# Patient Record
Sex: Male | Born: 1960 | Race: White | Hispanic: No | Marital: Married | Smoking: Current every day smoker
Health system: Southern US, Community
[De-identification: ages and names within clinical notes are randomized; demographics above are authoritative.]

## PROBLEM LIST (undated history)

## (undated) DIAGNOSIS — E119 Type 2 diabetes mellitus without complications: Secondary | ICD-10-CM

## (undated) HISTORY — PX: ROTATOR CUFF REPAIR: SHX139

## (undated) HISTORY — PX: TONSILLECTOMY: SUR1361

## (undated) HISTORY — PX: ABCESS DRAINAGE: SHX399

## (undated) HISTORY — PX: CHOLECYSTECTOMY: SHX55

## (undated) HISTORY — DX: Type 2 diabetes mellitus without complications: E11.9

---

## 2015-10-19 ENCOUNTER — Encounter: Payer: Self-pay | Admitting: Osteopathic Medicine

## 2015-10-19 ENCOUNTER — Ambulatory Visit (INDEPENDENT_AMBULATORY_CARE_PROVIDER_SITE_OTHER): Payer: Managed Care, Other (non HMO) | Admitting: Osteopathic Medicine

## 2015-10-19 VITALS — BP 148/79 | HR 84 | Wt 226.0 lb

## 2015-10-19 DIAGNOSIS — M9901 Segmental and somatic dysfunction of cervical region: Secondary | ICD-10-CM | POA: Diagnosis not present

## 2015-10-19 DIAGNOSIS — E119 Type 2 diabetes mellitus without complications: Secondary | ICD-10-CM

## 2015-10-19 DIAGNOSIS — M6530 Trigger finger, unspecified finger: Secondary | ICD-10-CM | POA: Diagnosis not present

## 2015-10-19 HISTORY — DX: Type 2 diabetes mellitus without complications: E11.9

## 2015-10-19 MED ORDER — IBUPROFEN 800 MG PO TABS
800.0000 mg | ORAL_TABLET | Freq: Three times a day (TID) | ORAL | Status: DC | PRN
Start: 2015-10-19 — End: 2017-02-20

## 2015-10-19 NOTE — Progress Notes (Signed)
HPI: Jeffrey Nichols is a 54 y.o. male who [facility] [place]  [place] for chief complaint of:  Chief Complaint  Patient presents with  . Hand Pain    left third finger and right thumb    . Location: R thumb and L 3rd finger . Quality: locking,  painful  . Duration: 1 month . Timing: painful with locking, otherwise dull pain  . Context:  no injury to her hands  . Modifying factors: no over-the-counter medications tried  . Assoc signs/symptoms: No numbness or tingling  Patient also complains of some chronic upper back and lower neck pain, chronic, occasional right shoulder pain, he has had surgery done on this shoulder in the past for rotator cuff.  Diabetes: Patient looking to transfer care to PCP who is closer to home, diabetes well-controlled, working with diabetic counselor through his employer, reports last A1c a few weeks ago was 7, no records available  Past medical, social and family history reviewed: Past Medical History  Diagnosis Date  . Diabetes mellitus [facility](HCC)    Past Surgical History  Procedure Laterality Date  . Rotator cuff repair Right   . Abcess drainage      Nipple   Social History  Substance Use Topics  . Smoking status: Current Every Day Smoker -- 1.00 packs/day for 30 years    Types: Cigarettes  . Smokeless tobacco: Not on file  . Alcohol Use: No   Family History  Problem Relation Age of Onset  . Bladder Cancer Father   . Skin cancer Father   . Diabetes Mother   . Diabetes Father     Current Outpatient Prescriptions  Medication Sig Dispense Refill  . enalapril (VASOTEC) 20 MG tablet     . glipiZIDE (GLUCOTROL) 5 MG tablet TAKE ONE TABLET BY MOUTH TWICE DAILY BEFORE MEAL(S)    . metFORMIN (GLUCOPHAGE) 1000 MG tablet TAKE ONE TABLET BY MOUTH TWICE DAILY WITH MEALS     No current facility-administered medications for this visit.   No Known Allergies    Review of Systems: CONSTITUTIONAL:  No  fever, no chills,  No  unintentional weight changes HEAD/EYES/EARS/NOSE/THROAT: No headache, no vision change, no hearing change, No  sore throat CARDIAC: No chest pain, no pressure/palpitations, no orthopnea RESPIRATORY: No  cough, No  shortness of breath/wheeze GASTROINTESTINAL: No nausea, no vomiting, no abdominal pain, no blood in stool, no diarrhea, no constipation MUSCULOSKELETAL: Yes  myalgia/arthralgia GENITOURINARY: No incontinence, No abnormal genital bleeding/discharge SKIN: No rash/wounds/concerning lesions HEM/ONC: No easy bruising/bleeding, no abnormal lymph node ENDOCRINE: No polyuria/polydipsia/polyphagia, no heat/cold intolerance  NEUROLOGIC: No weakness, no dizziness, no slurred speech PSYCHIATRIC: No concerns with depression, no concerns with anxiety, no sleep problems    Exam:  BP 148/79 mmHg  Pulse 84  Wt 226 lb (102.513 kg)  SpO2 100% Constitutional: VSS, see above. General Appearance: alert, well-developed, well-nourished, NAD Eyes: Normal lids and conjunctive, non-icteric sclera, Respiratory: Normal respiratory effort. no wheeze, no rhonchi, no rales Cardiovascular: S1/S2 normal, no murmur, no rub/gallop auscultated. RRR.  Musculoskeletal: Gait normal. No clubbing/cyanosis of digits.  positive locking on flexion and extension of left middle finger and right thumb consistent with trigger finger. Positive paraspinal tenderness right trapezius area, no mass, normal range of motion in cervical spine, Spurling's negative on right  Neurological: No cranial nerve deficit on limited exam. Motor and sensation intact and symmetric Psychiatric: Normal judgment/insight. Normal mood and affect. Oriented x3.    No results found for this  or any previous visit (from the past 72 hour(s)).    ASSESSMENT/PLAN: patient seen with the help of Dr. Clementeen Graham, patient declines injection for trigger finger at this time but advised that this is an option for him if pain continues, in the meantime  anti-inflammatory medication and splinting techniques were advised to the patient, he is amenable to trying this conservative therapy. Advised patient to return to clinic for OMT treatments of neck and thoracic spine. Advised return to clinic in 1-2 months for repeat blood work and annual physical exam  Trigger finger, left - Plan: ibuprofen (ADVIL,MOTRIN) 800 MG tablet  Trigger finger, right - Plan: ibuprofen (ADVIL,MOTRIN) 800 MG tablet  Somatic dysfunction of spine, cervical     No Follow-up on file.

## 2015-10-19 NOTE — Patient Instructions (Signed)
Trigger Finger °Trigger finger (digital tendinitis and stenosing tenosynovitis) is a common disorder that causes an often painful catching of the fingers or thumb. It occurs as a clicking, snapping, or locking of a finger in the palm of the hand. This is caused by a problem with the tendons that flex or bend the fingers sliding smoothly through their sheaths. The condition may occur in any finger or a couple fingers at the same time.  °The finger may lock with the finger curled or suddenly straighten out with a snap. This is more common in patients with rheumatoid arthritis and diabetes. Left untreated, the condition may get worse to the point where the finger becomes locked in flexion, like making a fist, or less commonly locked with the finger straightened out. °CAUSES  °· Inflammation and scarring that lead to swelling around the tendon sheath. °· Repeated or forceful movements. °· Rheumatoid arthritis, an autoimmune disease that affects joints. °· Gout. °· Diabetes mellitus. °SIGNS AND SYMPTOMS °· Soreness and swelling of your finger. °· A painful clicking or snapping as you bend and straighten your finger. °DIAGNOSIS  °Your health care provider will do a physical exam of your finger to diagnose trigger finger. °TREATMENT  °· Splinting for 6-8 weeks may be helpful. °· Nonsteroidal anti-inflammatory medicines (NSAIDs) can help to relieve the pain and inflammation. °· Cortisone injections, along with splinting, may speed up recovery. Several injections may be required. Cortisone may give relief after one injection. °· Surgery is another treatment that may be used if conservative treatments do not work. Surgery can be minor, without incisions (a cut does not have to be made), and can be done with a needle through the skin. °· Other surgical choices involve an open procedure in which the surgeon opens the hand through a small incision and cuts the pulley so the tendon can again slide smoothly. Your hand will still  work fine. °HOME CARE INSTRUCTIONS °· Apply ice to the injured area, twice per day: °¨ Put ice in a plastic bag. °¨ Place a towel between your skin and the bag. °¨ Leave the ice on for 20 minutes, 3-4 times a day. °· Rest your hand often. °MAKE SURE YOU:  °· Understand these instructions. °· Will watch your condition. °· Will get help right away if you are not doing well or get worse. °  °This information is not intended to replace advice given to you by your health care provider. Make sure you discuss any questions you have with your health care provider. °  °Document Released: 09/01/2004 Document Revised: 07/15/2013 Document Reviewed: 04/14/2013 °Elsevier Interactive Patient Education ©2016 [employer] ° °

## 2016-01-09 ENCOUNTER — Encounter: Payer: Self-pay | Admitting: Osteopathic Medicine

## 2016-01-09 ENCOUNTER — Ambulatory Visit (INDEPENDENT_AMBULATORY_CARE_PROVIDER_SITE_OTHER): Payer: Managed Care, Other (non HMO) | Admitting: Osteopathic Medicine

## 2016-01-09 VITALS — BP 133/54 | HR 72 | Ht 70.0 in | Wt 228.0 lb

## 2016-01-09 DIAGNOSIS — Z1159 Encounter for screening for other viral diseases: Secondary | ICD-10-CM

## 2016-01-09 DIAGNOSIS — M1990 Unspecified osteoarthritis, unspecified site: Secondary | ICD-10-CM

## 2016-01-09 DIAGNOSIS — M899 Disorder of bone, unspecified: Secondary | ICD-10-CM

## 2016-01-09 DIAGNOSIS — E119 Type 2 diabetes mellitus without complications: Secondary | ICD-10-CM

## 2016-01-09 DIAGNOSIS — M259 Joint disorder, unspecified: Secondary | ICD-10-CM

## 2016-01-09 DIAGNOSIS — I10 Essential (primary) hypertension: Secondary | ICD-10-CM | POA: Diagnosis not present

## 2016-01-09 LAB — POCT GLYCOSYLATED HEMOGLOBIN (HGB A1C): Hemoglobin A1C: 7.3

## 2016-01-09 MED ORDER — GLIPIZIDE 5 MG PO TABS: 5.0000 mg | ORAL_TABLET | Freq: Two times a day (BID) | ORAL | Status: DC

## 2016-01-09 MED ORDER — METFORMIN HCL 1000 MG PO TABS: 1000.0000 mg | ORAL_TABLET | Freq: Two times a day (BID) | ORAL | Status: DC

## 2016-01-09 MED ORDER — ENALAPRIL MALEATE 20 MG PO TABS: 40.0000 mg | ORAL_TABLET | Freq: Every day | ORAL | Status: DC

## 2016-01-09 NOTE — Patient Instructions (Addendum)
DIABETES -   A1C is 7.3. Work on diet and exercise to improve sugar control. We will follow up in 3 months. If A1C is better, great! If no better or if worse, we can talk about possible medication adjustments.   You are due for diabetic eye exam. Please let us know if you'd like a referral. When you get this exam done, please ask your eye doctor to forward a copy of your results to our office.    OTHER PREVENTIVE CARE -   Please sign a records release so we can review your previous records, particularly your immunization history.  The best thing you can do to prevent heart attack, stroke and lung problems is QUIT SMOKING! If you'd like help with this, please schedule a visit to discuss possible medication options.    TRIGGER FINGER -   If you'd like to follow up with Dr. Denyse Amass for an injection, please call to schedule an appointment. If you'd like referral to an orthopedic surgeon, please let us know and we will place a referral.      THANKS FOR COMING IN TODAY! PLEASE LET us KNOW IF THERE IS ANYTHING WE CAN DO FOR YOU! -DR. A.

## 2016-01-09 NOTE — Progress Notes (Signed)
HPI: Jeffrey Nichols is a 55 y.o. male who presents to [place] today for chief complaint of:  Chief Complaint  Patient presents with  . Annual Exam      Preventive care reviewed as below  DIABETES SCREENING/PREVENTIVE CARE: A1C past 3-6 mos: Yes  controlled? 7.3 BP goal <140/90: Yes  LDL goal <70: await labs Eye exam annually: No , importance discussed with patient.  Foot exam: Yes  Microalbuminuria:No need Metformin: Yes  ACE/ARB: Yes  Antiplatelet if ASCVD Risk >10%: await lipids to calculate risk Statin: No     Past medical, social and family history reviewed: Past Medical History  Diagnosis Date  . Diabetes mellitus ([place])   . Diabetes type 2, controlled ([place]) 10/19/2015   Past Surgical History  Procedure Laterality Date  . Rotator cuff repair Right   . Abcess drainage      Nipple   Social History  Substance Use Topics  . Smoking status: Current Every Day Smoker -- 1.00 packs/day for 30 years    Types: Cigarettes  . Smokeless tobacco: Not on file  . Alcohol Use: No   Family History  Problem Relation Age of Onset  . Bladder Cancer Father   . Skin cancer Father   . Diabetes Mother   . Diabetes Father     Current Outpatient Prescriptions  Medication Sig Dispense Refill  . enalapril (VASOTEC) 20 MG tablet     . glipiZIDE (GLUCOTROL) 5 MG tablet TAKE ONE TABLET BY MOUTH TWICE DAILY BEFORE MEAL(S)    . ibuprofen (ADVIL,MOTRIN) 800 MG tablet Take 1 tablet (800 mg total) by mouth every 8 (eight) hours as needed. 30 tablet 1  . metFORMIN (GLUCOPHAGE) 1000 MG tablet TAKE ONE TABLET BY MOUTH TWICE DAILY WITH MEALS     No current facility-administered medications for this visit.   No Known Allergies    Review of Systems: CONSTITUTIONAL:  No  fever, no chills, No  unintentional weight changes HEAD/EYES/EARS/NOSE/THROAT: No  headache, no vision change, no hearing change, No  sore throat, No  sinus pressure CARDIAC: No  chest  pain, No  pressure, No palpitations, No  orthopnea RESPIRATORY: No  cough, No  shortness of breath/wheeze GASTROINTESTINAL: No  nausea, No  vomiting, No  abdominal pain, No  blood in stool, No  diarrhea, No  constipation  MUSCULOSKELETAL: No  myalgia/arthralgia GENITOURINARY: No  incontinence, No  abnormal genital bleeding/discharge SKIN: No  rash/wounds/concerning lesions HEM/ONC: No  easy bruising/bleeding, No  abnormal lymph node ENDOCRINE: No polyuria/polydipsia/polyphagia, No  heat/cold intolerance  NEUROLOGIC: No  weakness, No  dizziness, No  slurred speech PSYCHIATRIC: No  concerns with depression, No  concerns with anxiety, No sleep problems  Exam:  BP 133/54 mmHg  Pulse 72  Ht _0  (1.778 m)  Wt 228 lb (103.42 kg)  BMI 32.71 kg/m2 Constitutional: VS see above. General Appearance: alert, well-developed, well-nourished, NAD Eyes: Normal lids and conjunctive, non-icteric sclera,  Ears, Nose, Mouth, Throat: MMM, Normal external inspection ears/nares/mouth/lips/gums,  Respiratory: Normal respiratory effort. (+) diffuse wheeze, no rhonchi, no rales Cardiovascular: S1/S2 normal, no murmur, no rub/gallop auscultated. RRR. No lower extremity edema. Gastrointestinal: Nontender, no masses. No hepatomegaly, no splenomegaly. No hernia appreciated. Bowel sounds normal. Rectal exam deferred.  Musculoskeletal: Gait normal. No clubbing/cyanosis of digits.  Neurological: No cranial nerve deficit on limited exam. Motor and sensation intact and symmetric Skin: warm, dry, intact. No rash/ulcer. No concerning nevi or subq nodules on limited exam.   Psychiatric:  Normal judgment/insight. Normal mood and affect. Oriented x3.  Diabetic Foot Exam - Simple   Simple Foot Form  Diabetic Foot exam was performed with the following findings:  Yes 01/09/2016  3:50 PM  Visual Inspection  No deformities, no ulcerations, no other skin breakdown bilaterally:  Yes  Sensation Testing  Intact to touch and  monofilament testing bilaterally:  Yes  Pulse Check  Posterior Tibialis and Dorsalis pulse intact bilaterally:  Yes  Comments        Results for orders placed or performed in visit on 01/09/16 (from the past 72 hour(s))  POCT HgB A1C     Status: None   Collection Time: 01/09/16  3:36 PM  Result Value Ref Range   Hemoglobin A1C 7.3     MALE PREVENTIVE CARE  ANNUAL SCREENING/COUNSELING Tobacco - Current  Smoked 1 packs per day for 30 years  - interested in quitting and made small attempts  Alcohol - social drinker Diet/Exercise - HEALTHY HABITS DISCUSSED TO DECREASE CV RISK -  Sexual Health - Yes with male. STI - The patient denies history of sexually transmitted disease. INTERESTED IN STI TESTING - no Depression - PQH2 Negative Domestic violence concerns - no HTN SCREENING - SEE VITALS Vaccination status - SEE BELOW  INFECTIOUS DISEASE SCREENING HIV - all adults 15-65 - does not need GC/CT - sexually active - does not need HepC - born 84-1964 - does not need, previously negative TB - if risk/required by employer - does not need  DISEASE SCREENING Lipid - (Low risk screen M35; High risk screen M25 if HTN, Tob, FH CHD M<55/F<65) - needs DM2 (45+ or Risk = FH 1st deg DM, Hx GDM, overweight/sedentary, high-risk ethnicity, HTN) - needs Osteoporosis - age 84+ or one sooner if risk - does not need AAA - once age 46-75 if smoker - does not need  CANCER SCREENING Lung - annual low dose CT Chest age 79-85 w/ 30+ PY, current/quit past 15 years - CT - does not need Colon - age 82+ or 54 years of age prior to [place] Dx - GI REFERRAL - needs - will get Cologuard  Prostate - (shared decision making age 45 in average-risk, age 60 to 14 high risk black men, men with a family history of prostate cancer younger than age 83, BRCA1 or BRCA2) - does not need   ADULT VACCINATION Influenza - annual - was offered and declined by the patient Td booster every 10 years - already has HPV - age  <62yo - was not indicated Zoster - age 40+ - was offered and declined by the patient Pneumonia - age 53+ sooner if risk (DM, smoker, other) - already has - need recods  OTHER Fall - exercise and Vit D age 35+ - does not need Consider ASA - age 72-59 - AWAIT LABS    ASSESSMENT/PLAN: See patient instructions.  Need to check on status of pneumonia and tetanus vaccines - await records. Declines flu and zoster vaccines. Cologuard. Needs eye exam. Labs as below.   Type 2 diabetes mellitus without complication, unspecified long term insulin use status ([place]) - Plan: POCT HgB A1C, glipiZIDE (GLUCOTROL) 5 MG tablet, metFORMIN (GLUCOPHAGE) 1000 MG tablet, CBC with Differential/Platelet, COMPLETE METABOLIC PANEL WITH GFR, Lipid panel  Essential hypertension - Plan: enalapril (VASOTEC) 20 MG tablet, TSH  Arthritis - Plan: VITAMIN D 25 Hydroxy (Vit-D Deficiency, Fractures)  Disease of bone and joint - Plan: VITAMIN D 25 Hydroxy (Vit-D Deficiency, Fractures)  Need for hepatitis  C screening test - Plan: Hepatitis C antibody   Return in about 3 months (around 04/07/2016), or sooner if needed, for DIABETES - RECHECK A1C.

## 2016-03-01 LAB — CBC WITH DIFFERENTIAL/PLATELET
BASOS PCT: 1 %
Basophils Absolute: 38 cells/uL (ref 0–200)
Eosinophils Absolute: 114 cells/uL (ref 15–500)
Eosinophils Relative: 3 %
HEMATOCRIT: 44.4 % (ref 38.5–50.0)
Hemoglobin: 15 g/dL (ref 13.2–17.1)
LYMPHS PCT: 32 %
Lymphs Abs: 1216 cells/uL (ref 850–3900)
MCH: 30.1 pg (ref 27.0–33.0)
MCHC: 33.8 g/dL (ref 32.0–36.0)
MCV: 89.2 fL (ref 80.0–100.0)
MONO ABS: 342 {cells}/uL (ref 200–950)
MONOS PCT: 9 %
MPV: 10.7 fL (ref 7.5–12.5)
Neutro Abs: 2090 cells/uL (ref 1500–7800)
Neutrophils Relative %: 55 %
PLATELETS: 110 10*3/uL — AB (ref 140–400)
RBC: 4.98 MIL/uL (ref 4.20–5.80)
RDW: 14.2 % (ref 11.0–15.0)
WBC: 3.8 10*3/uL (ref 3.8–10.8)

## 2016-03-02 LAB — COMPLETE METABOLIC PANEL WITH GFR
ALT: 37 U/L (ref 9–46)
AST: 31 U/L (ref 10–35)
Albumin: 3.9 g/dL (ref 3.6–5.1)
Alkaline Phosphatase: 70 U/L (ref 40–115)
BILIRUBIN TOTAL: 0.5 mg/dL (ref 0.2–1.2)
BUN: 10 mg/dL (ref 7–25)
CO2: 19 mmol/L — ABNORMAL LOW (ref 20–31)
CREATININE: 0.64 mg/dL — AB (ref 0.70–1.33)
Calcium: 8.8 mg/dL (ref 8.6–10.3)
Chloride: 108 mmol/L (ref 98–110)
GFR, Est African American: >89 mL/min (ref >=60)
GFR, Est Non African American: >89 mL/min (ref >=60)
Glucose, Bld: 106 mg/dL — ABNORMAL HIGH (ref 65–99)
Potassium: 4.1 mmol/L (ref 3.5–5.3)
Sodium: 139 mmol/L (ref 135–146)
Total Protein: 7 g/dL (ref 6.1–8.1)

## 2016-03-02 LAB — HEPATITIS C ANTIBODY: HCV Ab: NEGATIVE

## 2016-03-02 LAB — LIPID PANEL
CHOL/HDL RATIO: 3.9 ratio (ref <=5.0)
Cholesterol: 122 mg/dL — ABNORMAL LOW (ref 125–200)
HDL: 31 mg/dL — AB (ref >=40)
LDL CALC: 72 mg/dL (ref <130)
Triglycerides: 93 mg/dL (ref <150)
VLDL: 19 mg/dL (ref <30)

## 2016-03-02 LAB — VITAMIN D 25 HYDROXY (VIT D DEFICIENCY, FRACTURES): VIT D 25 HYDROXY: 43 ng/mL (ref 30–100)

## 2016-03-02 LAB — TSH: TSH: 2.44 m[IU]/L (ref 0.40–4.50)

## 2016-03-06 ENCOUNTER — Encounter: Payer: Self-pay | Admitting: Osteopathic Medicine

## 2016-03-06 ENCOUNTER — Ambulatory Visit (INDEPENDENT_AMBULATORY_CARE_PROVIDER_SITE_OTHER): Payer: Managed Care, Other (non HMO) | Admitting: Osteopathic Medicine

## 2016-03-06 VITALS — BP 151/88 | HR 85 | Wt 229.0 lb

## 2016-03-06 DIAGNOSIS — L989 Disorder of the skin and subcutaneous tissue, unspecified: Secondary | ICD-10-CM | POA: Diagnosis not present

## 2016-03-06 DIAGNOSIS — R6889 Other general symptoms and signs: Secondary | ICD-10-CM

## 2016-03-06 DIAGNOSIS — D696 Thrombocytopenia, unspecified: Secondary | ICD-10-CM | POA: Diagnosis not present

## 2016-03-06 DIAGNOSIS — E119 Type 2 diabetes mellitus without complications: Secondary | ICD-10-CM

## 2016-03-06 NOTE — Progress Notes (Signed)
HPI: Jeffrey Nichols is a 55 y.o. male who presents to [facility] today for chief complaint of:  Chief Complaint  Patient presents with  . check lesions    SKIN . Location: scalp and privates . Quality: bothersome lesions, itchy, also wants to make sure not cancer  . Duration: reports groin lesions have been present several years, not sure about ones on head, no significant recent changes to these . Assoc signs/symptoms: no ulceration of skin, no significant pain  TESTES He's also worried about testicular abnormality on the L - has had w/u of this before and was no concern but now becoming more painful over time. No abnormal penile pain or discharge.   LAB RESULTS Pt would like to go over labs - cholesterol, diabetes, platelets   Past medical, social and family history reviewed: Past Medical History  Diagnosis Date  . Diabetes mellitus ([facility])   . Diabetes type 2, controlled ([facility]) 10/19/2015   Past Surgical History  Procedure Laterality Date  . Rotator cuff repair Right   . Abcess drainage      Nipple   Social History  Substance Use Topics  . Smoking status: Current Every Day Smoker -- 1.00 packs/day for 30 years    Types: Cigarettes  . Smokeless tobacco: Not on file  . Alcohol Use: No   Family History  Problem Relation Age of Onset  . Bladder Cancer Father   . Skin cancer Father   . Diabetes Mother   . Diabetes Father     Current Outpatient Prescriptions  Medication Sig Dispense Refill  . enalapril (VASOTEC) 20 MG tablet Take 2 tablets (40 mg total) by mouth daily. 180 tablet 2  . glipiZIDE (GLUCOTROL) 5 MG tablet Take 1 tablet (5 mg total) by mouth 2 (two) times daily before a meal. 180 tablet 2  . ibuprofen (ADVIL,MOTRIN) 800 MG tablet Take 1 tablet (800 mg total) by mouth every 8 (eight) hours as needed. 30 tablet 1  . metFORMIN (GLUCOPHAGE) 1000 MG tablet Take 1 tablet (1,000 mg total) by mouth 2 (two) times daily with a meal. 180  tablet 2  . Misc Natural Products (OSTEO BI-FLEX ADV TRIPLE ST) TABS Take by mouth.     No current facility-administered medications for this visit.   No Known Allergies     Review of Systems: CONSTITUTIONAL:  No  fever, no chills, No  unintentional weight changes HEAD/EYES/EARS/NOSE/THROAT: No  headache, no vision change CARDIAC: No  chest pain, No  pressure, No palpitations,  SKIN: (+) rash/wounds/concerning lesions HEM/ONC: No  easy bruising/bleeding, No  abnormal lymph node PSYCHIATRIC: No  concerns with depression, (+) concerns with anxiety about lab results and skin lesions, No sleep problems  Exam:  BP 151/88 mmHg  Pulse 85  Wt 229 lb (103.874 kg) Constitutional: VS see above. General Appearance: alert, well-developed, well-nourished, NAD Eyes: Normal lids and conjunctive, non-icteric sclera,  Respiratory: Normal respiratory effort. no wheeze, no rhonchi, no rales Cardiovascular: S1/S2 normal, no murmur, no rub/gallop auscultated. RRR.  Neurological: No cranial nerve deficit on limited exam. Motor and sensation intact and symmetric Skin: warm, dry, intact. Skin of scalp: crown of head (+) hyperpigmented lesions with irregular borders, no erythema, mild discoloration but concerning for non-benign lesion. 2 other lesions c/w actinic keratosis on top head and possible seborrheic keratosis above L ear. Rough stuck-on appearing hyperpigmented lesions one on penis, one on mons, on eon scrotum - c/w possible hyperpigmented seborrheic keratoses.  Psychiatric: Normal judgment/insight.  Normal mood and affect. Oriented x3.  Genitalia: see above re: skin. L testicle ?nodule vs stricture around testis, no varicosity, nontender. R testicle palpated normal.      ASSESSMENT/PLAN: Concern for possible malignancy on scalp - urgent referral to derm to consider biopsy as I think wide excision may be needed and I am reluctant to do this procedure in my office on the scalp - seek second opinion on  this as well as confirmation of diagnosis for other benign lesions and pt would like to discuss removal of the lesions on genitals. Will also schedule US of testicles, very low suspicion for torsion, though ER/RTC precautions reviewed, no records available from previous w/u. Labs reviewed in detail with the patient, discussed cardiac risk factors at length and importance of quitting smoking.   Skin lesion of scalp - Plan: Ambulatory referral to Dermatology  Skin lesion - Plan: Ambulatory referral to Dermatology  Abnormal testicular exam  Type 2 diabetes mellitus without complication, unspecified long term insulin use status (HCC)  Thrombocytopenia (HCC)     Return as directed for DM2 monitoring.

## 2016-03-07 DIAGNOSIS — E119 Type 2 diabetes mellitus without complications: Secondary | ICD-10-CM | POA: Insufficient documentation

## 2016-03-07 DIAGNOSIS — D696 Thrombocytopenia, unspecified: Secondary | ICD-10-CM | POA: Insufficient documentation

## 2016-03-07 DIAGNOSIS — R6889 Other general symptoms and signs: Secondary | ICD-10-CM | POA: Insufficient documentation

## 2016-03-07 DIAGNOSIS — L989 Disorder of the skin and subcutaneous tissue, unspecified: Secondary | ICD-10-CM | POA: Insufficient documentation

## 2016-05-23 ENCOUNTER — Ambulatory Visit: Payer: Managed Care, Other (non HMO) | Admitting: Osteopathic Medicine

## 2016-06-07 ENCOUNTER — Telehealth: Payer: Self-pay

## 2016-06-07 NOTE — Telephone Encounter (Signed)
COLOGUARD WAS SUSPENDED FOR INACTIVITY ON 02/13/201. IT WAS EXCEEDED  DAYS WITHOUT ACTIVITY. PATIENT HAS BEEN INFORMED. Byrne Capek,CMA

## 2016-06-18 ENCOUNTER — Ambulatory Visit (INDEPENDENT_AMBULATORY_CARE_PROVIDER_SITE_OTHER): Payer: Managed Care, Other (non HMO) | Admitting: Osteopathic Medicine

## 2016-06-18 ENCOUNTER — Encounter: Payer: Self-pay | Admitting: Osteopathic Medicine

## 2016-06-18 VITALS — BP 129/81 | HR 76 | Ht 70.0 in | Wt 225.0 lb

## 2016-06-18 DIAGNOSIS — E119 Type 2 diabetes mellitus without complications: Secondary | ICD-10-CM

## 2016-06-18 DIAGNOSIS — J0110 Acute frontal sinusitis, unspecified: Secondary | ICD-10-CM | POA: Diagnosis not present

## 2016-06-18 LAB — POCT GLYCOSYLATED HEMOGLOBIN (HGB A1C): HEMOGLOBIN A1C: 6.6

## 2016-06-18 MED ORDER — AMOXICILLIN-POT CLAVULANATE 875-125 MG PO TABS: 1.0000 | ORAL_TABLET | Freq: Two times a day (BID) | ORAL | 0 refills | Status: DC

## 2016-06-18 NOTE — Progress Notes (Signed)
HPI: Jeffrey Nichols is a 55 y.o. Not Hispanic or Latino male  who presents to [facility] [place] today, 06/18/16,  for chief complaint of:  Chief Complaint  Patient presents with  . Follow-up    A1C    DM2 - A1c  improvement on metformin and glipizide.   HEADACHE - going on for about a week or so, occasional dizziness. Thinks it may have something to do with sinus, recently had cold/sinus congestion symptoms, seems of localized in forehead area. No fever/chills. No loss of consciousness, no falls. No vision changes or aura symptoms. Actively smoking. No other medications tried for pain    Past medical, surgical, social and family history reviewed: Past Medical History:  Diagnosis Date  . Diabetes mellitus (HCC)   . Diabetes type 2, controlled (HCC) 10/19/2015   Past Surgical History:  Procedure Laterality Date  . ABCESS DRAINAGE     Nipple  . ROTATOR CUFF REPAIR Right    Social History  Substance Use Topics  . Smoking status: Current Every Day Smoker    Packs/day: 1.00    Years: 30.00    Types: Cigarettes  . Smokeless tobacco: Not on file  . Alcohol use No   Family History  Problem Relation Age of Onset  . Bladder Cancer Father   . Skin cancer Father   . Diabetes Mother   . Diabetes Father      Current medication list and allergy/intolerance information reviewed:   Current Outpatient Prescriptions  Medication Sig Dispense Refill  . enalapril (VASOTEC) 20 MG tablet Take 2 tablets (40 mg total) by mouth daily. 180 tablet 2  . glipiZIDE (GLUCOTROL) 5 MG tablet Take 1 tablet (5 mg total) by mouth 2 (two) times daily before a meal. 180 tablet 2  . ibuprofen (ADVIL,MOTRIN) 800 MG tablet Take 1 tablet (800 mg total) by mouth every 8 (eight) hours as needed. 30 tablet 1  . metFORMIN (GLUCOPHAGE) 1000 MG tablet Take 1 tablet (1,000 mg total) by mouth 2 (two) times daily with a meal. 180 tablet 2  . Misc Natural Products (OSTEO BI-FLEX ADV TRIPLE ST)  TABS Take by mouth.     No current facility-administered medications for this visit.    No Known Allergies    Review of Systems:  Constitutional:  No  fever, no chills, No unintentional weight changes. No significant fatigue.   HEENT: (+) headache, no vision change, no hearing change, No sore throat, (+) sinus pressure  Cardiac: No  chest pain, No  pressure  Respiratory:  No  shortness of breath. No  Cough  Neurologic: No  weakness, (+) occasional dizziness, No  slurred speech/focal weakness/facial droop   Exam:  BP 129/81   Pulse 76   Ht  (1.778 m)   Wt 225 lb (102.1 kg)   BMI 32.28 kg/m   Constitutional: VS see above. General Appearance: alert, well-developed, well-nourished, NAD  Eyes: Normal lids and conjunctive, non-icteric sclera  Ears, Nose, Mouth, Throat: MMM, Normal external inspection ears/nares/mouth/lips/gums. TM normal bilaterally. Pharynx/tonsils no erythema, no exudate. Nasal mucosa normal. Positive tenderness to percussion over frontal/ethmoid sinuses.  Neck: No masses, trachea midline. No thyroid enlargement. No tenderness/mass appreciated. No lymphadenopathy  Respiratory: Normal respiratory effort. no wheeze, no rhonchi, no rales  Cardiovascular: S1/S2 normal, no murmur, no rub/gallop auscultated. RRR.    Neurological: No cranial nerve deficit on limited exam. Motor and sensation intact and symmetric. Cerebellar reflexes intact. Normal balance/coordination. No tremor.   Results for  orders placed or performed in visit on 06/18/16 (from the past 72 hour(s))  POCT HgB A1C     Status: None   Collection Time: 06/18/16 10:48 AM  Result Value Ref Range   Hemoglobin A1C 6.6      ASSESSMENT/PLAN:   Continue current medications for diabetes, plan to recheck A1c in 6 months.  Try antibiotics/over-the-counter medications as noted in patient instructions for sinusitis/possible seasonal allergies. If dizziness/headache fail to improve, return to clinic,  RTC ASAP if worse  Type 2 diabetes mellitus without complication, unspecified long term insulin use status (HCC) - Plan: POCT HgB A1C   DIABETES SCREENING/PREVENTIVE CARE:  Updated 06/18/16   A1C past 3-6 mos: Yes 6.6% 06/18/16  BP goal <140/90: Yes   LDL goal <70: 72 as of 03/01/2016  Eye exam annually: No , importance discussed with patient  Foot exam: 01/09/2016  Microalbuminuria: n/a on ACE/ARB  Metformin: Yes   ACE/ARB: Yes   Antiplatelet if ASCVD Risk >10%: No   Statin: No    Acute frontal sinusitis, recurrence not specified - Plan: amoxicillin-clavulanate (AUGMENTIN) 875-125 MG tablet     Visit summary with medication list and pertinent instructions was printed for patient to review. All questions at time of visit were answered - patient instructed to contact office with any additional concerns. ER/RTC precautions were reviewed with the patient. Follow-up plan: Return in about 6 months (around 12/19/2016), or sooner if needed for headache/sinus problems, for DIABETES RECHECK.

## 2016-06-18 NOTE — Patient Instructions (Addendum)
Try over-the-counter allergy/antihistmine and/or decongestant medicines: Benadryl (sedating but helpful for bedtime) Allegra / Fexofenadine - least sedating Sudafed or any antihistamine with -D on the end (behind the counter) Flonase Ibuprofen or Acetaminophen for pain  Let us know if no better with 5 - 7 days antibiotics, or call/ return to clinic ASAP if worse.   Recommend complete the cologuard testing, complete annual eye exam, and reduce/quit smoking!

## 2016-08-09 ENCOUNTER — Emergency Department
Admission: EM | Admit: 2016-08-09 | Discharge: 2016-08-09 | Disposition: A | Discharge status: Home / Self Care | Payer: Managed Care, Other (non HMO) | Source: Home / Self Care | Attending: Family Medicine | Admitting: Family Medicine

## 2016-08-09 ENCOUNTER — Encounter: Payer: Self-pay | Admitting: Emergency Medicine

## 2016-08-09 ENCOUNTER — Emergency Department (INDEPENDENT_AMBULATORY_CARE_PROVIDER_SITE_OTHER): Payer: Managed Care, Other (non HMO)

## 2016-08-09 DIAGNOSIS — M7732 Calcaneal spur, left foot: Secondary | ICD-10-CM

## 2016-08-09 DIAGNOSIS — M79672 Pain in left foot: Secondary | ICD-10-CM

## 2016-08-09 DIAGNOSIS — M7660 Achilles tendinitis, unspecified leg: Secondary | ICD-10-CM

## 2016-08-09 MED ORDER — MELOXICAM 7.5 MG PO TABS: 7.5000 mg | ORAL_TABLET | Freq: Every day | ORAL | 0 refills | Status: DC

## 2016-08-09 MED ORDER — HYDROCODONE-ACETAMINOPHEN 5-325 MG PO TABS: 1.0000 | ORAL_TABLET | Freq: Four times a day (QID) | ORAL | 0 refills | Status: DC | PRN

## 2016-08-09 NOTE — ED Triage Notes (Signed)
Left achilles tendon pain from walking on Sunday

## 2016-08-09 NOTE — ED Provider Notes (Signed)
CSN: [id number]     Arrival date & time 08/09/16  1600 History   First MD Initiated Contact with Patient 08/09/16 1608     Chief Complaint  Patient presents with  . Foot Pain   (Consider location/radiation/quality/duration/timing/severity/associated sxs/prior Treatment) HPI Jeffrey Nichols is a 55 y.o. male presenting to UC with c/o Left posterior heel pain that radiates up his achilles tendon.  Pain is aching and burning at times, worse with movement and palpation.  Symptoms started 3 days ago after pt used an elliptical machine for the first time, alternating between fast and slow speeds. Denies feeling or hearing a "pop" but states that is the only thing he can think of that caused current pain.  Pain is moderate in severity.  He has tried 800mg  ibuprofen at night but states he still wakes every few hours due to the pain.  Hx of bursitis in his hip and hx of DM with occasional burning in his feet so he wanted to come be evaluated to make sure he did not do anything serious to his ankle.  Denies swelling or bruising of ankle or calf.   Past Medical History:  Diagnosis Date  . Diabetes mellitus (HCC)   . Diabetes type 2, controlled (HCC) 10/19/2015   Past Surgical History:  Procedure Laterality Date  . ABCESS DRAINAGE     Nipple  . ROTATOR CUFF REPAIR Right    Family History  Problem Relation Age of Onset  . Bladder Cancer Father   . Skin cancer Father   . Diabetes Father   . Diabetes Mother    Social History  Substance Use Topics  . Smoking status: Current Every Day Smoker    Packs/day: 1.00    Years: 30.00    Types: Cigarettes  . Smokeless tobacco: Never Used  . Alcohol use 0.0 oz/week    Review of Systems  Musculoskeletal: Positive for arthralgias, gait problem and myalgias. Negative for joint swelling.       Left heel/achilles tendon  Skin: Negative for color change and wound.  Neurological: Negative for weakness and numbness.    Allergies  Review of patient's  allergies indicates no known allergies.  Home Medications   Prior to Admission medications   Medication Sig Start Date End Date Taking? Authorizing Provider  enalapril (VASOTEC) 20 MG tablet Take 2 tablets (40 mg total) by mouth daily. 01/09/16   Sunnie Nielsen, DO  glipiZIDE (GLUCOTROL) 5 MG tablet Take 1 tablet (5 mg total) by mouth 2 (two) times daily before a meal. 01/09/16   Sunnie Nielsen, DO  HYDROcodone-acetaminophen (NORCO/VICODIN) 5-325 MG tablet Take 1-2 tablets by mouth every 6 (six) hours as needed. 08/09/16   Junius Finner, PA-C  ibuprofen (ADVIL,MOTRIN) 800 MG tablet Take 1 tablet (800 mg total) by mouth every 8 (eight) hours as needed. 10/19/15   Sunnie Nielsen, DO  meloxicam (MOBIC) 7.5 MG tablet Take 1-2 tablets (7.5-15 mg total) by mouth daily. Take 2 tabs once daily for 5 days then 1-2 tabs daily as needed for pain 08/09/16   Junius Finner, PA-C  metFORMIN (GLUCOPHAGE) 1000 MG tablet Take 1 tablet (1,000 mg total) by mouth 2 (two) times daily with a meal. 01/09/16   Sunnie Nielsen, DO   Meds Ordered and Administered this Visit  Medications - No data to display  BP 157/80 (BP Location: Left Arm)   Pulse 94   Temp 98.3 F (36.8 C) (Oral)   Ht 5\' 10"  (1.778 m)   Wt 227 lb (  103 kg)   SpO2 97%   BMI 32.57 kg/m  No data found.   Physical Exam  Constitutional: He is oriented to person, place, and time. He appears well-developed and well-nourished.  HENT:  Head: Normocephalic and atraumatic.  Eyes: EOM are normal.  Neck: Normal range of motion.  Cardiovascular: Normal rate.   Pulmonary/Chest: Effort normal.  Musculoskeletal: Normal range of motion. He exhibits tenderness. He exhibits no edema.  Left ankle: no edema or deformity. Full ROM with increased pain on dorsiflexion.  Calf is soft, tenderness along achilles tendon and posterior heel over insertion site of tendon.  Full ROM toes and Left knee w/o pain. Negative Thompson test.  Neurological: He is alert  and oriented to person, place, and time.  Antalgic gait  Skin: Skin is warm and dry. Capillary refill takes less than 2 seconds. No erythema.  Left ankle and calf: skin in tact. No ecchymosis, erythema or warmth   Psychiatric: He has a normal mood and affect. His behavior is normal.  Nursing note and vitals reviewed.   Urgent Care Course   Clinical Course    Procedures (including critical care time)  Labs Review Labs Reviewed - No data to display  Imaging Review Dg Ankle Complete Left  Result Date: 08/09/2016 CLINICAL DATA:  Left posterior and lateral ankle pain for several days, injured on exercise machine EXAM: LEFT ANKLE COMPLETE - 3+ VIEW COMPARISON:  None. FINDINGS: No acute fracture is seen. Alignment is normal. The ankle joint appears normal. Degenerative spurs are noted from calcaneus. IMPRESSION: No acute abnormality.  Degenerative calcaneal spurs. Electronically Signed   By: Dwyane DeePaul  Barry M.D.   On: 08/09/2016 16:35     MDM   1. Heel pain, left   2. Pain in Achilles tendon   3. Calcaneal spur of foot, left    Pain and tenderness over achilles tendon and posterior heel at insertion site of achilles tendon.  Negative Thompson test.  Plain films: no acute abnormality. Degenerative spurs noted from calcaneus.    Discussed potential causes of pain with pt including calcaneal bursitis vs partial achilles tendon rupture. Doubt total rupture given Negative Thompson test.  Discussed consult with Sports Medicine today with U/S vs conservative treatment and f/u as needed. Pt states he knows he would not consider surgery as he has had surgery on his shoulder in the past and does not feel it was worth it. He would like to try ASO splint with crutches and home exercises. Agreed to f/u with Sports Medicine in 1 week if not improving, sooner if worsening.  Rx: Meloxicam and Norco Pt declined work note stating he has to go to work and there are two flights of stairs, no elevator but  also no "light duty available"     Junius Finnerrin O'Malley, PA-C 08/09/16 1722

## 2016-08-09 NOTE — Discharge Instructions (Signed)
Norco/Vicodin (hydrocodone-acetaminophen) is a narcotic pain medication, do not combine these medications with others containing tylenol. While taking, do not drink alcohol, drive, or perform any other activities that requires focus while taking these medications.  ° °Meloxicam (Mobic) is an antiinflammatory to help with pain and inflammation.  Do not take ibuprofen, Advil, Aleve, or any other medications that contain NSAIDs while taking meloxicam as this may cause stomach upset or even ulcers if taken in large amounts for an extended period of time.  ° ° °

## 2016-08-20 ENCOUNTER — Other Ambulatory Visit: Payer: Self-pay

## 2016-08-20 DIAGNOSIS — I10 Essential (primary) hypertension: Secondary | ICD-10-CM

## 2016-08-20 MED ORDER — ENALAPRIL MALEATE 20 MG PO TABS: 40.0000 mg | ORAL_TABLET | Freq: Every day | ORAL | 0 refills | Status: DC

## 2016-08-20 NOTE — Telephone Encounter (Signed)
Patient request refill for Enalapril. 20 mg # 180 0 refills sent to [facility]. Patient advised appointment needed for further refills. Rhonda Cunningham,CMA

## 2016-08-26 ENCOUNTER — Other Ambulatory Visit: Payer: Self-pay | Admitting: Osteopathic Medicine

## 2016-08-26 DIAGNOSIS — E119 Type 2 diabetes mellitus without complications: Secondary | ICD-10-CM

## 2016-10-23 ENCOUNTER — Other Ambulatory Visit: Payer: Self-pay | Admitting: Osteopathic Medicine

## 2016-10-23 DIAGNOSIS — E119 Type 2 diabetes mellitus without complications: Secondary | ICD-10-CM

## 2016-11-13 ENCOUNTER — Ambulatory Visit (INDEPENDENT_AMBULATORY_CARE_PROVIDER_SITE_OTHER): Payer: Managed Care, Other (non HMO) | Admitting: Family Medicine

## 2016-11-13 ENCOUNTER — Encounter: Payer: Self-pay | Admitting: Family Medicine

## 2016-11-13 VITALS — BP 143/68 | HR 85

## 2016-11-13 DIAGNOSIS — M65331 Trigger finger, right middle finger: Secondary | ICD-10-CM | POA: Diagnosis not present

## 2016-11-13 NOTE — Progress Notes (Signed)
Subjective:    I'm seeing this patient as a consultation for:  Jeffrey NielsenNatalie Alexander, DO   CC: Right hand trigger finger  HPI: Patient has triggering in his right hand had his third and fourth digits. This is been ongoing for about a year intermittently. He's tried conservative bracing which has helped some. Recently he notes trigger has worsened. His fingers becomes stuck in the flexed position and they're require forceful extension. He feels well otherwise with no fevers or chills. He also has tried some ibuprofen which helps a little. He denies any recent injury. He notes the fourth digit is worse in the third digit.  Past medical history, Surgical history, Family history not pertinant except as noted below, Social history, Allergies, and medications have been entered into the medical record, reviewed, and no changes needed.   Review of Systems: No headache, visual changes, nausea, vomiting, diarrhea, constipation, dizziness, abdominal pain, skin rash, fevers, chills, night sweats, weight loss, swollen lymph nodes, body aches, joint swelling, muscle aches, chest pain, shortness of breath, mood changes, visual or auditory hallucinations.   Objective:    Vitals:   11/13/16 1554  BP: (!) 143/68  Pulse: 85   General: Well Developed, well nourished, and in no acute distress.  Neuro/Psych: Alert and oriented x3, extra-ocular muscles intact, able to move all 4 extremities, sensation grossly intact. Skin: Warm and dry, no rashes noted.  Respiratory: Not using accessory muscles, speaking in full sentences, trachea midline.  Cardiovascular: Pulses palpable, no extremity edema. Abdomen: Does not appear distended. MSK: Triggering and tender  palmar MCP present right hand third and fourth digits.   Procedure: Real-time Ultrasound Guided Injection of right 4th mcp trigger finger  Device: GE Logiq E  Images permanently stored and available for review in the ultrasound unit. Verbal informed  consent obtained. Discussed risks and benefits of procedure. Warned about infection bleeding damage to structures skin hypopigmentation and fat atrophy among others. Patient expresses understanding and agreement Time-out conducted.  Noted no overlying erythema, induration, or other signs of local infection.  Skin prepped in a sterile fashion.  Local anesthesia: Topical Ethyl chloride.  With sterile technique and under real time ultrasound guidance: 1.5 mL of lidocaine and 5 mg of dexamethasone injected easily.  Completed without difficulty    Advised to call if fevers/chills, erythema, induration, drainage, or persistent bleeding.  Images permanently stored and available for review in the ultrasound unit.  Impression: Technically successful ultrasound guided injection.  Procedure: Real-time Ultrasound Guided Injection of right 3rd mcp trigger finger   Device: GE Logiq E  Images permanently stored and available for review in the ultrasound unit. Verbal informed consent obtained. Discussed risks and benefits of procedure. Warned about infection bleeding damage to structures skin hypopigmentation and fat atrophy among others. Patient expresses understanding and agreement Time-out conducted.  Noted no overlying erythema, induration, or other signs of local infection.  Skin prepped in a sterile fashion.  Local anesthesia: Topical Ethyl chloride.  With sterile technique and under real time ultrasound guidance: 1.5 mL of lidocaine and 5 mg of dexamethasone injected easily  Completed without difficulty   Advised to call if fevers/chills, erythema, induration, drainage, or persistent bleeding.  Images permanently stored and available for review in the ultrasound unit.  Impression: Technically successful ultrasound guided injection.    No results found for this or any previous visit (from the past 24 hour(s)). No results found.  Impression and Recommendations:     Assessment and Plan: 55 y.o. male  with  Right hand trigger finger at fourth and third digits.  Plan for injection above this patient has failed conservative management. Additionally we'll use double Band-Aid splint and return as needed.   Discussed warning signs or symptoms. Please see discharge instructions. Patient expresses understanding.

## 2016-11-13 NOTE — Patient Instructions (Signed)
Thank you for coming in today. Continue the double bandaid splints.  Take it easy.  Return as needed.  Call or go to the ER if you develop a large red swollen joint with extreme pain or oozing puss.   Trigger Finger Trigger finger (digital tendinitis and stenosing tenosynovitis) is a common disorder that causes an often painful catching of the fingers or thumb. It occurs as a clicking, snapping, or locking of a finger in the palm of the hand. This is caused by a problem with the tendons that flex or bend the fingers sliding smoothly through their sheaths. The condition may occur in any finger or a couple fingers at the same time.  The finger may lock with the finger curled or suddenly straighten out with a snap. This is more common in patients with rheumatoid arthritis and diabetes. Left untreated, the condition may get worse to the point where the finger becomes locked in flexion, like making a fist, or less commonly locked with the finger straightened out. CAUSES   Inflammation and scarring that lead to swelling around the tendon sheath.  Repeated or forceful movements.  Rheumatoid arthritis, an autoimmune disease that affects joints.  Gout.  Diabetes mellitus. SIGNS AND SYMPTOMS  Soreness and swelling of your finger.  A painful clicking or snapping as you bend and straighten your finger. DIAGNOSIS  Your health care provider will do a physical exam of your finger to diagnose trigger finger. TREATMENT   Splinting for 6-8 weeks may be helpful.  Nonsteroidal anti-inflammatory medicines (NSAIDs) can help to relieve the pain and inflammation.  Cortisone injections, along with splinting, may speed up recovery. Several injections may be required. Cortisone may give relief after one injection.  Surgery is another treatment that may be used if conservative treatments do not work. Surgery can be minor, without incisions (a cut does not have to be made), and can be done with a needle  through the skin.  Other surgical choices involve an open procedure in which the surgeon opens the hand through a small incision and cuts the pulley so the tendon can again slide smoothly. Your hand will still work fine. HOME CARE INSTRUCTIONS  Apply ice to the injured area, twice per day:  Put ice in a plastic bag.  Place a towel between your skin and the bag.  Leave the ice on for 20 minutes, 3-4 times a day.  Rest your hand often. MAKE SURE YOU:   Understand these instructions.  Will watch your condition.  Will get help right away if you are not doing well or get worse. This information is not intended to replace advice given to you by your health care provider. Make sure you discuss any questions you have with your health care provider. Document Released: 09/01/2004 Document Revised: 07/15/2013 Document Reviewed: 04/14/2013 [employer]vier Interactive Patient Education  2017 [employer]

## 2017-01-25 ENCOUNTER — Other Ambulatory Visit: Payer: Self-pay | Admitting: Osteopathic Medicine

## 2017-01-25 DIAGNOSIS — I10 Essential (primary) hypertension: Secondary | ICD-10-CM

## 2017-02-20 ENCOUNTER — Encounter: Payer: Self-pay | Admitting: Osteopathic Medicine

## 2017-02-20 ENCOUNTER — Ambulatory Visit (INDEPENDENT_AMBULATORY_CARE_PROVIDER_SITE_OTHER): Payer: Managed Care, Other (non HMO) | Admitting: Osteopathic Medicine

## 2017-02-20 VITALS — BP 138/76 | HR 85 | Ht 70.0 in | Wt 231.6 lb

## 2017-02-20 DIAGNOSIS — Z125 Encounter for screening for malignant neoplasm of prostate: Secondary | ICD-10-CM

## 2017-02-20 DIAGNOSIS — I10 Essential (primary) hypertension: Secondary | ICD-10-CM | POA: Diagnosis not present

## 2017-02-20 DIAGNOSIS — Z114 Encounter for screening for human immunodeficiency virus [HIV]: Secondary | ICD-10-CM

## 2017-02-20 DIAGNOSIS — M65331 Trigger finger, right middle finger: Secondary | ICD-10-CM | POA: Diagnosis not present

## 2017-02-20 DIAGNOSIS — E119 Type 2 diabetes mellitus without complications: Secondary | ICD-10-CM

## 2017-02-20 DIAGNOSIS — Z1211 Encounter for screening for malignant neoplasm of colon: Secondary | ICD-10-CM | POA: Diagnosis not present

## 2017-02-20 LAB — POCT GLYCOSYLATED HEMOGLOBIN (HGB A1C): HEMOGLOBIN A1C: 8.2

## 2017-02-20 MED ORDER — MELOXICAM 7.5 MG PO TABS: 7.5000 mg | ORAL_TABLET | Freq: Every day | ORAL | 5 refills | Status: DC

## 2017-02-20 MED ORDER — ENALAPRIL MALEATE 20 MG PO TABS: 20.0000 mg | ORAL_TABLET | Freq: Two times a day (BID) | ORAL | 3 refills | Status: DC

## 2017-02-20 MED ORDER — METFORMIN HCL 1000 MG PO TABS: 1000.0000 mg | ORAL_TABLET | Freq: Two times a day (BID) | ORAL | 3 refills | Status: DC

## 2017-02-20 MED ORDER — GLIPIZIDE 5 MG PO TABS: 5.0000 mg | ORAL_TABLET | Freq: Two times a day (BID) | ORAL | 3 refills | Status: DC

## 2017-02-20 NOTE — Progress Notes (Signed)
HPI: Jeffrey Nichols is a 56 y.o. male  who presents to [facility] today, 02/20/17,  for chief complaint of:  Chief Complaint  Patient presents with  . Diabetes  . Follow-up    Diabetes: Overdue for A1c follow-up. States he had been getting these checked by his employer but we do not currently have records. A1c is elevated today.  Trigger finger: Getting a bit worse despite injections with sports medicine. Requests refill for all to hand surgeon  Hypertension: No chest pain, pressure, shortness of breath. No home blood pressures to report. Doing well on current medications.  Preventive care issues: Okay to screen for HIV and prostate cancer. Decided against the cologuard and prefers to get colonoscopy, requests referral.  Past medical history, surgical history, social history and family history reviewed.  Patient Active Problem List   Diagnosis Date Noted  . Skin lesion of scalp 03/07/2016  . Skin lesion 03/07/2016  . Abnormal testicular exam 03/07/2016  . Type 2 diabetes mellitus without complication (HCC) 03/07/2016  . Thrombocytopenia (HCC) 03/07/2016  . Trigger finger, left 10/19/2015  . Trigger finger, right 10/19/2015    Current medication list and allergy/intolerance information reviewed.   Current Outpatient Prescriptions on File Prior to Visit  Medication Sig Dispense Refill  . enalapril (VASOTEC) 20 MG tablet TAKE TWO TABLETS BY MOUTH ONCE DAILY **APPOINTMENT  NEEDED  FOR  FURTHER  REFILLS** 30 tablet 0  . glipiZIDE (GLUCOTROL) 5 MG tablet TAKE ONE TABLET BY MOUTH TWICE DAILY BEFORE A MEAL 180 tablet 2  . HYDROcodone-acetaminophen (NORCO/VICODIN) 5-325 MG tablet Take 1-2 tablets by mouth every 6 (six) hours as needed. 10 tablet 0  . ibuprofen (ADVIL,MOTRIN) 800 MG tablet Take 1 tablet (800 mg total) by mouth every 8 (eight) hours as needed. 30 tablet 1  . meloxicam (MOBIC) 7.5 MG tablet Take 1-2 tablets (7.5-15 mg total) by mouth daily.  Take 2 tabs once daily for 5 days then 1-2 tabs daily as needed for pain 30 tablet 0  . metFORMIN (GLUCOPHAGE) 1000 MG tablet TAKE ONE TABLET BY MOUTH TWICE DAILY WITH MEALS 180 tablet 2   No current facility-administered medications on file prior to visit.    No Known Allergies    Review of Systems:  Constitutional: No recent illness  HEENT: No  headache, no vision change  Cardiac: No  chest pain, No  pressure, No palpitations  Respiratory:  No  shortness of breath. No  Cough  Gastrointestinal: No  abdominal pain, no change on bowel habits  Musculoskeletal: No new myalgia/arthralgia  Skin: No  Rash  Hem/Onc: No  easy bruising/bleeding, No  abnormal lumps/bumps  Neurologic: No  weakness, No  Dizziness  Psychiatric: No  concerns with depression, No  concerns with anxiety  Exam:  BP 138/76   Pulse 85   Ht 5\' 10"  (1.778 m)   Wt 231 lb 9.6 oz (105.1 kg)   SpO2 97%   BMI 33.23 kg/m   Constitutional: VS see above. General Appearance: alert, well-developed, well-nourished, NAD  Eyes: Normal lids and conjunctive, non-icteric sclera  Ears, Nose, Mouth, Throat: MMM, Normal external inspection ears/nares/mouth/lips/gums.  Neck: No masses, trachea midline.   Respiratory: Normal respiratory effort. no wheeze, no rhonchi, no rales  Cardiovascular: S1/S2 normal, no murmur, no rub/gallop auscultated. RRR.   Musculoskeletal: Gait normal. Symmetric and independent movement of all extremities  Neurological: Normal balance/coordination. No tremor.  Skin: warm, dry, intact.   Psychiatric: Normal judgment/insight. Normal mood and affect.  Oriented x3.    Recent Results (from the past 2160 hour(s))  POCT HgB A1C     Status: None   Collection Time: 02/20/17  4:02 PM  Result Value Ref Range   Hemoglobin A1C 8.2     ASSESSMENT/PLAN:   Type 2 diabetes mellitus without complication, unspecified long term insulin use status (HCC) - Discussed lifestyle modifications, recheck 3  months. Patient would like to avoid adding meds and states will get more diligent about diet/exercise - Plan: metFORMIN (GLUCOPHAGE) 1000 MG tablet, glipiZIDE (GLUCOTROL) 5 MG tablet, CBC with Differential/Platelet, COMPLETE METABOLIC PANEL WITH GFR, Lipid panel, POCT HgB A1C  Trigger middle finger of right hand - Plan: Ambulatory referral to Hand Surgery, CANCELED: Ambulatory referral to Hand Surgery  Screen for colon cancer - Plan: Ambulatory referral to Gastroenterology  Essential hypertension - Plan: enalapril (VASOTEC) 20 MG tablet  Screening for prostate cancer - Plan: PSA, Total with Reflex to PSA, Free  Screening for HIV (human immunodeficiency virus) - Plan: HIV antibody    Follow-up plan: Return in about 3 months (around 05/23/2017) for A1C recheck - come see me if 7.0% or higher .  Visit summary with medication list and pertinent instructions was printed for patient to review, alert us if any changes needed. All questions at time of visit were answered - patient instructed to contact office with any additional concerns. ER/RTC precautions were reviewed with the patient and understanding verbalized.

## 2017-03-05 ENCOUNTER — Emergency Department (INDEPENDENT_AMBULATORY_CARE_PROVIDER_SITE_OTHER): Payer: Managed Care, Other (non HMO)

## 2017-03-05 ENCOUNTER — Encounter: Payer: Self-pay | Admitting: *Deleted

## 2017-03-05 ENCOUNTER — Emergency Department
Admission: EM | Admit: 2017-03-05 | Discharge: 2017-03-05 | Disposition: A | Discharge status: Home / Self Care | Payer: Managed Care, Other (non HMO) | Source: Home / Self Care | Attending: Family Medicine | Admitting: Family Medicine

## 2017-03-05 DIAGNOSIS — R0989 Other specified symptoms and signs involving the circulatory and respiratory systems: Secondary | ICD-10-CM

## 2017-03-05 DIAGNOSIS — J111 Influenza due to unidentified influenza virus with other respiratory manifestations: Secondary | ICD-10-CM

## 2017-03-05 DIAGNOSIS — R0602 Shortness of breath: Secondary | ICD-10-CM | POA: Diagnosis not present

## 2017-03-05 DIAGNOSIS — R69 Illness, unspecified: Secondary | ICD-10-CM

## 2017-03-05 MED ORDER — GUAIFENESIN-CODEINE 100-10 MG/5ML PO SOLN: ORAL | 0 refills | Status: DC

## 2017-03-05 MED ORDER — DOXYCYCLINE HYCLATE 100 MG PO CAPS: 100.0000 mg | ORAL_CAPSULE | Freq: Two times a day (BID) | ORAL | 0 refills | Status: DC

## 2017-03-05 MED ORDER — PREDNISONE 20 MG PO TABS: ORAL_TABLET | ORAL | 0 refills | Status: DC

## 2017-03-05 NOTE — Discharge Instructions (Signed)
Take plain guaifenesin (1200mg extended release tabs such as Mucinex) twice daily, with plenty of water, for cough and congestion.  Get adequate rest.   °May use Afrin nasal spray (or generic oxymetazoline) each morning for about 5 days and then discontinue.  Also recommend using saline nasal spray several times daily and saline nasal irrigation (AYR is a common brand).  Use Flonase nasal spray each morning after using Afrin nasal spray and saline nasal irrigation. °Try warm salt water gargles for sore throat.  °Stop all antihistamines for now, and other non-prescription cough/cold preparations. °  °  °

## 2017-03-05 NOTE — ED Triage Notes (Signed)
Patient c/o 2-3 days of wet non-productive cough with fever and aches. T-max 102. SOB at times. Taken Robitussin, Mucinex IBF

## 2017-03-05 NOTE — ED Provider Notes (Signed)
[facility]    CSN: [id number] Arrival date & time: 03/05/17  1722     History   Chief Complaint Chief Complaint  Patient presents with  . Cough  . Fever    HPI Cavon Nicolls is a 56 y.o. male.   Complains of 4 day history flu-like illness including myalgias, headache, fever/chills, fatigue, and cough.  Also has mild nasal congestion and sore throat.  Cough is non-productive and somewhat worse at night.  He conplains of tightness in his anterior chest when he coughs.  No shortness of breath. He developed recurrent fever today.   The history is provided by the patient.    Past Medical History:  Diagnosis Date  . Diabetes mellitus (HCC)   . Diabetes type 2, controlled (HCC) 10/19/2015    Patient Active Problem List   Diagnosis Date Noted  . Skin lesion of scalp 03/07/2016  . Skin lesion 03/07/2016  . Abnormal testicular exam 03/07/2016  . Type 2 diabetes mellitus without complication (HCC) 03/07/2016  . Thrombocytopenia (HCC) 03/07/2016  . Trigger finger, left 10/19/2015  . Trigger finger, right 10/19/2015    Past Surgical History:  Procedure Laterality Date  . ABCESS DRAINAGE     Nipple  . ROTATOR CUFF REPAIR Right        Home Medications    Prior to Admission medications   Medication Sig Start Date End Date Taking? Authorizing Provider  doxycycline (VIBRAMYCIN) 100 MG capsule Take 1 capsule (100 mg total) by mouth 2 (two) times daily. Take with food. 03/05/17   Lattie Haw, MD  enalapril (VASOTEC) 20 MG tablet Take 1 tablet (20 mg total) by mouth 2 (two) times daily. 02/20/17   Sunnie Nielsen, DO  glipiZIDE (GLUCOTROL) 5 MG tablet Take 1 tablet (5 mg total) by mouth 2 (two) times daily before a meal. 02/20/17   Sunnie Nielsen, DO  guaiFENesin-codeine 100-10 MG/5ML syrup Take 10mL by mouth at bedtime as needed for cough 03/05/17   Lattie Haw, MD  metFORMIN (GLUCOPHAGE) 1000 MG tablet Take 1 tablet (1,000 mg total) by mouth 2 (two)  times daily with a meal. 02/20/17   Sunnie Nielsen, DO  predniSONE (DELTASONE) 20 MG tablet Take one tab by mouth once daily with food. 03/05/17   Lattie Haw, MD    Family History Family History  Problem Relation Age of Onset  . Bladder Cancer Father   . Skin cancer Father   . Diabetes Father   . Diabetes Mother     Social History Social History  Substance Use Topics  . Smoking status: Current Every Day Smoker    Packs/day: 1.00    Years: 30.00    Types: Cigarettes  . Smokeless tobacco: Never Used  . Alcohol use 0.0 oz/week     Allergies   Patient has no known allergies.   Review of Systems Review of Systems + sore throat + cough ? pleuritic pain; has sensation of tightness in anterior chest with cough No wheezing + nasal congestion + post-nasal drainage No sinus pain/pressure No itchy/red eyes No earache No hemoptysis No SOB + fever, + chills No nausea No vomiting No abdominal pain No diarrhea No urinary symptoms No skin rash + fatigue + myalgias + headache Used OTC meds without relief   Physical Exam Triage Vital Signs ED Triage Vitals  Enc Vitals Group     BP 03/05/17 1800 (!) 160/76     Pulse Rate 03/05/17 1800 88     Resp  03/05/17 1800 14     Temp 03/05/17 1800 98.8 F (37.1 C)     Temp Source 03/05/17 1800 Oral     SpO2 03/05/17 1800 95 %     Weight 03/05/17 1801 227 lb (103 kg)     Height --      Head Circumference --      Peak Flow --      Pain Score 03/05/17 1801 7     Pain Loc --      Pain Edu? --      Excl. in GC? --    No data found.   Updated Vital Signs BP (!) 160/76 (BP Location: Left Arm)   Pulse 88   Temp 98.8 F (37.1 C) (Oral)   Resp 14   Wt 227 lb (103 kg)   SpO2 95%   BMI 32.57 kg/m   Visual Acuity Right Eye Distance:   Left Eye Distance:   Bilateral Distance:    Right Eye Near:   Left Eye Near:    Bilateral Near:     Physical Exam Nursing notes and Vital Signs reviewed. Appearance:  Patient  appears stated age, and in no acute distress Eyes:  Pupils are equal, round, and reactive to light and accomodation.  Extraocular movement is intact.  Conjunctivae are not inflamed  Ears:  Canals normal.  Tympanic membranes normal.  Nose:  Mildly congested turbinates.  No sinus tenderness.    Pharynx:  Normal Neck:  Supple.  Tender enlarged posterior/lateral nodes are palpated bilaterally  Lungs:  Clear to auscultation.  Breath sounds are equal.  Moving air well. Heart:  Regular rate and rhythm without murmurs, rubs, or gallops.  Abdomen:  Nontender without masses or hepatosplenomegaly.  Bowel sounds are present.  No CVA or flank tenderness.  Extremities:  No edema.  Skin:  No rash present.    UC Treatments / Results  Labs (all labs ordered are listed, but only abnormal results are displayed) Labs Reviewed - No data to display  EKG  EKG Interpretation None       Radiology Dg Chest 2 View  Result Date: 03/05/2017 CLINICAL DATA:  Shortness of breath and left chest pressure over the last 4 days. EXAM: CHEST  2 VIEW COMPARISON:  None. FINDINGS: Heart size is normal. Mediastinal shadows are normal. The lungs are clear. No bronchial thickening. No infiltrate, mass, effusion or collapse. Pulmonary vascularity is normal. No bony abnormality. IMPRESSION: Normal chest Electronically Signed   By: Paulina Fusi M.D.   On: 03/05/2017 18:25    Procedures Procedures (including critical care time)  Medications Ordered in UC Medications - No data to display   Initial Impression / Assessment and Plan / UC Course  I have reviewed the triage vital signs and the nursing notes.  Pertinent labs & imaging results that were available during my care of the patient were reviewed by me and considered in my medical decision making (see chart for details).    Patient has missed 48 hour window of opportunity for beginning Tamiflu.  Will begin empiric doxycycline, and low dose prednisone burst. Rx for  Robitussin AC for night time cough.  Take plain guaifenesin (  extended release tabs such as Mucinex) twice daily, with plenty of water, for cough and congestion.  Get adequate rest.   May use Afrin nasal spray (or generic oxymetazoline) each morning for about 5 days and then discontinue.  Also recommend using saline nasal spray several times daily and saline nasal irrigation (  AYR is a common brand).  Use Flonase nasal spray each morning after using Afrin nasal spray and saline nasal irrigation. Try warm salt water gargles for sore throat.  Stop all antihistamines for now, and other non-prescription cough/cold preparations. Followup with Family Doctor if not improved in one week.     Final Clinical Impressions(s) / UC Diagnoses   Final diagnoses:  Influenza-like illness    New Prescriptions New Prescriptions   DOXYCYCLINE (VIBRAMYCIN) 100 MG CAPSULE    Take 1 capsule (100 mg total) by mouth 2 (two) times daily. Take with food.   GUAIFENESIN-CODEINE 100-10 MG/5ML SYRUP    Take 10mL by mouth at bedtime as needed for cough   PREDNISONE (DELTASONE) 20 MG TABLET    Take one tab by mouth once daily with food.     Lattie Haw, MD 03/08/17 1022

## 2017-03-26 ENCOUNTER — Encounter: Payer: Self-pay | Admitting: Osteopathic Medicine

## 2017-03-26 LAB — CBC WITH DIFFERENTIAL/PLATELET
BASOS PCT: 1 %
Basophils Absolute: 41 cells/uL (ref 0–200)
Eosinophils Absolute: 82 cells/uL (ref 15–500)
Eosinophils Relative: 2 %
HEMATOCRIT: 43.5 % (ref 38.5–50.0)
Hemoglobin: 14.7 g/dL (ref 13.2–17.1)
LYMPHS PCT: 25 %
Lymphs Abs: 1025 cells/uL (ref 850–3900)
MCH: 30.7 pg (ref 27.0–33.0)
MCHC: 33.8 g/dL (ref 32.0–36.0)
MCV: 90.8 fL (ref 80.0–100.0)
MONO ABS: 328 {cells}/uL (ref 200–950)
MPV: 10.7 fL (ref 7.5–12.5)
Monocytes Relative: 8 %
NEUTROS PCT: 64 %
Neutro Abs: 2624 cells/uL (ref 1500–7800)
PLATELETS: 98 10*3/uL — AB (ref 140–400)
RBC: 4.79 MIL/uL (ref 4.20–5.80)
RDW: 14.7 % (ref 11.0–15.0)
WBC: 4.1 10*3/uL (ref 3.8–10.8)

## 2017-03-27 LAB — COMPLETE METABOLIC PANEL WITH GFR
ALBUMIN: 3.9 g/dL (ref 3.6–5.1)
ALK PHOS: 88 U/L (ref 40–115)
ALT: 45 U/L (ref 9–46)
AST: 37 U/L — AB (ref 10–35)
BUN: 9 mg/dL (ref 7–25)
CALCIUM: 8.8 mg/dL (ref 8.6–10.3)
CO2: 21 mmol/L (ref 20–31)
Chloride: 104 mmol/L (ref 98–110)
Creat: 0.64 mg/dL — ABNORMAL LOW (ref 0.70–1.33)
GLUCOSE: 154 mg/dL — AB (ref 65–99)
POTASSIUM: 4 mmol/L (ref 3.5–5.3)
Sodium: 137 mmol/L (ref 135–146)
Total Bilirubin: 0.7 mg/dL (ref 0.2–1.2)
Total Protein: 6.9 g/dL (ref 6.1–8.1)

## 2017-03-27 LAB — LIPID PANEL
CHOLESTEROL: 133 mg/dL (ref <200)
HDL: 40 mg/dL — AB (ref >40)
LDL Cholesterol: 78 mg/dL (ref <100)
TRIGLYCERIDES: 73 mg/dL (ref <150)
Total CHOL/HDL Ratio: 3.3 Ratio (ref <5.0)
VLDL: 15 mg/dL (ref <30)

## 2017-03-27 LAB — PSA, TOTAL WITH REFLEX TO PSA, FREE: PSA, Total: 0.4 ng/mL (ref <=4.0)

## 2017-03-27 LAB — HIV ANTIBODY (ROUTINE TESTING W REFLEX): HIV: NONREACTIVE

## 2017-04-01 LAB — HM COLONOSCOPY

## 2017-04-17 ENCOUNTER — Encounter: Payer: Self-pay | Admitting: Osteopathic Medicine

## 2017-04-17 DIAGNOSIS — K635 Polyp of colon: Secondary | ICD-10-CM | POA: Insufficient documentation

## 2017-05-03 ENCOUNTER — Encounter: Payer: Self-pay | Admitting: Osteopathic Medicine

## 2017-08-05 ENCOUNTER — Other Ambulatory Visit: Payer: Self-pay | Admitting: Osteopathic Medicine

## 2017-08-05 DIAGNOSIS — I10 Essential (primary) hypertension: Secondary | ICD-10-CM

## 2017-08-13 ENCOUNTER — Encounter: Payer: Self-pay | Admitting: Osteopathic Medicine

## 2017-08-13 ENCOUNTER — Ambulatory Visit (INDEPENDENT_AMBULATORY_CARE_PROVIDER_SITE_OTHER): Payer: Managed Care, Other (non HMO) | Admitting: Osteopathic Medicine

## 2017-08-13 VITALS — BP 136/82 | HR 87 | Wt 231.0 lb

## 2017-08-13 DIAGNOSIS — M545 Low back pain, unspecified: Secondary | ICD-10-CM

## 2017-08-13 DIAGNOSIS — E119 Type 2 diabetes mellitus without complications: Secondary | ICD-10-CM | POA: Diagnosis not present

## 2017-08-13 DIAGNOSIS — M65331 Trigger finger, right middle finger: Secondary | ICD-10-CM

## 2017-08-13 DIAGNOSIS — I10 Essential (primary) hypertension: Secondary | ICD-10-CM

## 2017-08-13 MED ORDER — CYCLOBENZAPRINE HCL 10 MG PO TABS: 5.0000 mg | ORAL_TABLET | Freq: Three times a day (TID) | ORAL | 0 refills | Status: DC | PRN

## 2017-08-13 MED ORDER — METHYLPREDNISOLONE 4 MG PO TBPK: ORAL_TABLET | ORAL | 0 refills | Status: DC

## 2017-08-13 MED ORDER — NAPROXEN 500 MG PO TABS
500.0000 mg | ORAL_TABLET | Freq: Two times a day (BID) | ORAL | 1 refills | Status: DC
Start: 2017-08-13 — End: 2017-10-02

## 2017-08-13 NOTE — Progress Notes (Signed)
HPI: Jeffrey Nichols is a 56 y.o. male  who presents to [facility] today, 08/13/17,  for chief complaint of:  Chief Complaint  Patient presents with  . Back Pain    lower  . Neck Pain    Back and neck pain  . Context: no known injury, hx occasional LBP . Location: lower back bilaterally, neck/rhomboid/trap on L . Quality: sore, aching . Duration: 1+ week . Timing: constant, worse with twisting . Assoc signs/symptoms: no numbness/electrical shooting type pain   Trigger finger - hand pain and inability to close hand well, previous injection for trigger finger by sports med helped a little, requests referral to hand specialist    Past medical history, surgical history, social history and family history reviewed.  Patient Active Problem List   Diagnosis Date Noted  . Colon polyps 04/17/2017  . Skin lesion of scalp 03/07/2016  . Skin lesion 03/07/2016  . Abnormal testicular exam 03/07/2016  . Type 2 diabetes mellitus without complication (HCC) 03/07/2016  . Thrombocytopenia (HCC) 03/07/2016  . Trigger finger, left 10/19/2015  . Trigger finger, right 10/19/2015    Current medication list and allergy/intolerance information reviewed.   Current Outpatient Prescriptions on File Prior to Visit  Medication Sig Dispense Refill  . enalapril (VASOTEC) 20 MG tablet Take 1 tablet (20 mg total) by mouth 2 (two) times daily. APPOINTMENT NEEDED FOR FURTHER REFILLS 60 tablet 0  . glipiZIDE (GLUCOTROL) 5 MG tablet Take 1 tablet (5 mg total) by mouth 2 (two) times daily before a meal. 180 tablet 3  . metFORMIN (GLUCOPHAGE) 1000 MG tablet Take 1 tablet (1,000 mg total) by mouth 2 (two) times daily with a meal. 180 tablet 3   No current facility-administered medications on file prior to visit.    No Known Allergies    Review of Systems:  Constitutional: No recent illness  HEENT: No  headache, no vision change  Cardiac: No  chest pain  Respiratory:  No   shortness of breath. No  Cough  Gastrointestinal: No  abdominal pain,  Musculoskeletal: +myalgia/arthralgia as per HPI  Skin: No  Rash  Neurologic: No  weakness, No  Dizziness  Exam:  BP 136/82   Pulse 87   Wt 231 lb (104.8 kg)   BMI 33.15 kg/m    Constitutional: VS see above. General Appearance: alert, well-developed, well-nourished, NAD  Eyes: Normal lids and conjunctive, non-icteric sclera  Ears, Nose, Mouth, Throat: MMM, Normal external inspection ears/nares/mouth/lips/gums.  Neck: No masses, trachea midline.   Respiratory: Normal respiratory effort. no wheeze, no rhonchi, no rales  Cardiovascular: S1/S2 normal, no murmur, no rub/gallop auscultated. RRR.   Musculoskeletal: Gait normal. Symmetric and independent movement of all extremities. Neg SLR bilaterally, (+) muscle spasm L paraspinal muscle lower C / upper T spine, (+) mucsle spasm L lower lumbar   Neurological: Normal balance/coordination. No tremor.  Skin: warm, dry, intact.   Psychiatric: Normal judgment/insight. Normal mood and affect. Oriented x3.      ASSESSMENT/PLAN:   Acute bilateral low back pain without sciatica - Plan: naproxen (NAPROSYN) 500 MG tablet, cyclobenzaprine (FLEXERIL) 10 MG tablet, methylPREDNISolone (MEDROL DOSEPAK) 4 MG TBPK tablet  Trigger middle finger of right hand - Plan: Ambulatory referral to Orthopedic Surgery  Essential hypertension - controlled on current meds   Type 2 diabetes mellitus without complication, without long-term current use of insulin (HCC) - states recent A1C ok at work, we don't have recods, will request     Patient Instructions  If back/neck is not better with meds and home exercises, or if it gets worse, I would recommend physical therapy. Just call our office and ask for a referral to PT.     Follow-up plan: Return if symptoms worsen or fail to improve.  Visit summary with medication list and pertinent instructions was printed for patient to  review, alert [place] if any changes needed. All questions at time of visit were answered - patient instructed to contact office with any additional concerns. ER/RTC precautions were reviewed with the patient and understanding verbalized.

## 2017-08-13 NOTE — Patient Instructions (Signed)
If back/neck is not better with meds and home exercises, or if it gets worse, I would recommend physical therapy. Just call our office and ask for a referral to PT.

## 2017-09-26 LAB — LIPID PANEL
Cholesterol: 100 (ref 0–200)
HDL: 33 — AB (ref 35–70)
LDL Cholesterol: 47
TRIGLYCERIDES: 101 (ref 40–160)

## 2017-09-26 LAB — HEMOGLOBIN A1C: HEMOGLOBIN A1C: 7.5

## 2017-10-02 ENCOUNTER — Ambulatory Visit: Payer: Managed Care, Other (non HMO) | Admitting: Osteopathic Medicine

## 2017-10-02 ENCOUNTER — Encounter: Payer: Self-pay | Admitting: Osteopathic Medicine

## 2017-10-02 VITALS — BP 147/77 | HR 88 | Temp 98.2°F | Resp 16 | Wt 228.3 lb

## 2017-10-02 DIAGNOSIS — E119 Type 2 diabetes mellitus without complications: Secondary | ICD-10-CM | POA: Diagnosis not present

## 2017-10-02 DIAGNOSIS — M545 Low back pain, unspecified: Secondary | ICD-10-CM

## 2017-10-02 DIAGNOSIS — I10 Essential (primary) hypertension: Secondary | ICD-10-CM | POA: Diagnosis not present

## 2017-10-02 DIAGNOSIS — G8929 Other chronic pain: Secondary | ICD-10-CM

## 2017-10-02 MED ORDER — ENALAPRIL MALEATE 20 MG PO TABS: 40.0000 mg | ORAL_TABLET | Freq: Every day | ORAL | 1 refills | Status: DC

## 2017-10-02 MED ORDER — DICLOFENAC SODIUM 1 % TD GEL: 2.0000 g | Freq: Four times a day (QID) | TRANSDERMAL | 11 refills | Status: DC

## 2017-10-02 MED ORDER — CELECOXIB 100 MG PO CAPS: 100.0000 mg | ORAL_CAPSULE | Freq: Two times a day (BID) | ORAL | 1 refills | Status: DC

## 2017-10-02 MED ORDER — CYCLOBENZAPRINE HCL 10 MG PO TABS: 5.0000 mg | ORAL_TABLET | Freq: Three times a day (TID) | ORAL | 1 refills | Status: DC | PRN

## 2017-10-02 NOTE — Progress Notes (Signed)
HPI: Jeffrey Nichols is a 56 y.o. male who  has a past medical history of Diabetes mellitus (HCC) and Diabetes type 2, controlled (HCC) (10/19/2015).  he presents to [facility] [place] today, 10/02/17,  for chief complaint of:  Chief Complaint  Patient presents with  . Medication Refill    Enalapril      Reports recent A1C 7.5 - gets these measured at work but we have not been getting results  HTN: 150's/80's at work. 172/80's at highest. Has home BP cuff also and BP 140s or 150s.  Home blood pressure cuff is not verified. He is not rechecking after 5 minutes of sitting, after numbers are high. No chest pain, headache, shortness of breath. He is reluctant to start new medications, would like to try diet/lifestyle modifications first.  LBP: chronic issue   Past medical, surgical, social and family history reviewed:  Patient Active Problem List   Diagnosis Date Noted  . Colon polyps 04/17/2017  . Skin lesion of scalp 03/07/2016  . Skin lesion 03/07/2016  . Abnormal testicular exam 03/07/2016  . Type 2 diabetes mellitus without complication (HCC) 03/07/2016  . Thrombocytopenia (HCC) 03/07/2016  . Trigger finger, left 10/19/2015  . Trigger finger, right 10/19/2015    Past Surgical History:  Procedure Laterality Date  . ABCESS DRAINAGE     Nipple  . ROTATOR CUFF REPAIR Right     Social History   Tobacco Use  . Smoking status: Current Every Day Smoker    Packs/day: 1.00    Years: 30.00    Pack years: 30.00    Types: Cigarettes  . Smokeless tobacco: Never Used  Substance Use Topics  . Alcohol use: Yes    Alcohol/week: 0.0 oz    Family History  Problem Relation Age of Onset  . Bladder Cancer Father   . Skin cancer Father   . Diabetes Father   . Diabetes Mother      Current medication list and allergy/intolerance information reviewed:    Current Outpatient Medications  Medication Sig Dispense Refill  . cyclobenzaprine (FLEXERIL) 10 MG  tablet Take 0.5-1 tablets (5-10 mg total) by mouth 3 (three) times daily as needed. 30 tablet 0  . enalapril (VASOTEC) 20 MG tablet Take 1 tablet (20 mg total) by mouth 2 (two) times daily. APPOINTMENT NEEDED FOR FURTHER REFILLS 60 tablet 0  . glipiZIDE (GLUCOTROL) 5 MG tablet Take 1 tablet (5 mg total) by mouth 2 (two) times daily before a meal. 180 tablet 3  . metFORMIN (GLUCOPHAGE) 1000 MG tablet Take 1 tablet (1,000 mg total) by mouth 2 (two) times daily with a meal. 180 tablet 3  . naproxen (NAPROSYN) 500 MG tablet Take 1 tablet (500 mg total) by mouth 2 (two) times daily with a meal. For one week, then bid prn pain after that 60 tablet 1  . methylPREDNISolone (MEDROL DOSEPAK) 4 MG TBPK tablet 6-day pack as directed 21 tablet 0   No current facility-administered medications for this visit.     No Known Allergies    Review of Systems:  Constitutional:  No  fever, no chills, No recent illness  HEENT: No  headache, no vision change  Cardiac: No  chest pain, No  pressure, No palpitations,  Respiratory:  No  shortness of breath. No  Cough  Gastrointestinal: No  abdominal pain, No  nausea,  Musculoskeletal: No new myalgia/arthralgia  Skin: No  Rash  Neurologic: No  weakness, No  dizziness   Exam:  BP (!) 147/77 (BP Location: Right Arm, Patient Position: Sitting, Cuff Size: Large)   Pulse 88   Temp 98.2 F (36.8 C) (Oral)   Resp 16   Wt 228 lb 4.8 oz (103.6 kg)   SpO2 97%   BMI 32.76 kg/m   Constitutional: VS see above. General Appearance: alert, well-developed, well-nourished, NAD  Eyes: Normal lids and conjunctive, non-icteric sclera  Ears, Nose, Mouth, Throat: MMM, Normal external inspection ears/nares/mouth/lips/gums.   Neck: No masses, trachea midline. No thyroid enlargement.   Respiratory: Normal respiratory effort. no wheeze, no rhonchi, no rales  Cardiovascular: S1/S2 normal, no murmur, no rub/gallop auscultated. RRR.   Musculoskeletal: Gait  normal.  Neurological: Normal balance/coordination. No tremor.   Psychiatric: Normal judgment/insight. Normal mood and affect. Oriented x3.      ASSESSMENT/PLAN:    Type 2 diabetes mellitus without complication, without long-term current use of insulin (HCC) - need records as these are available! Pt given our fax number, will try again to get us these results for A1C  Essential hypertension - declines additional BP meds at this time. To bring home BP cuff for verification  - Plan: enalapril (VASOTEC) 20 MG tablet  Chronic bilateral low back pain without sciatica - Plan: cyclobenzaprine (FLEXERIL) 10 MG tablet    Patient Instructions   Plan to return for nurse visit to verify home blood pressure cuff. In the meantime, be keeping a record of you rblood pressures at home and bring this with you to the visit with the nurse.   If your cuff is measuring within 5-10 points of ours AND your home numbers are less than 130/80 then nothing else to do.   If your home blood pressure cuff is inaccurate or is accurate but measuring above goal, we will need to talk about adjusting your medications.      If pain is not better or if it gets worse, I would recommend follow-up with one Dr Denyse Amassorey for further evaluation in 2-4 weeks. Just call our office and ask for an appointment for sports medicine!       Visit summary with medication list and pertinent instructions was printed for patient to review. All questions at time of visit were answered - patient instructed to contact office with any additional concerns. ER/RTC precautions were reviewed with the patient. Follow-up plan: Return for pain follow up with Dr Denyse Amassorey, nurse visit in 2 weeks for BP monitor confirmation .  Note: Total time spent 25 minutes, greater than 50% of the visit was spent face-to-face counseling and coordinating care for the following: The primary encounter diagnosis was Type 2 diabetes mellitus without complication, without  long-term current use of insulin (HCC). Diagnoses of Essential hypertension and Chronic bilateral low back pain without sciatica were also pertinent to this visit.Marland Kitchen.  Please note: voice recognition software was used to produce this document, and typos may escape review. Please contact Dr. Lyn HollingsheadAlexander for any needed clarifications.

## 2017-10-02 NOTE — Patient Instructions (Addendum)
   Plan to return for nurse visit to verify home blood pressure cuff. In the meantime, be keeping a record of you rblood pressures at home and bring this with you to the visit with the nurse.   If your cuff is measuring within 5-10 points of ours AND your home numbers are less than 130/80 then nothing else to do.   If your home blood pressure cuff is inaccurate or is accurate but measuring above goal, we will need to talk about adjusting your medications.      If pain is not better or if it gets worse, I would recommend follow-up with one Dr Denyse Amassorey for further evaluation in 2-4 weeks. Just call our office and ask for an appointment for sports medicine!

## 2017-10-03 ENCOUNTER — Encounter: Payer: Self-pay | Admitting: Osteopathic Medicine

## 2017-10-03 DIAGNOSIS — G8929 Other chronic pain: Secondary | ICD-10-CM | POA: Insufficient documentation

## 2017-10-03 DIAGNOSIS — M545 Low back pain, unspecified: Secondary | ICD-10-CM | POA: Insufficient documentation

## 2017-11-08 ENCOUNTER — Encounter: Payer: Self-pay | Admitting: Family Medicine

## 2017-12-30 ENCOUNTER — Other Ambulatory Visit: Payer: Self-pay

## 2017-12-30 ENCOUNTER — Emergency Department (INDEPENDENT_AMBULATORY_CARE_PROVIDER_SITE_OTHER)
Admission: EM | Admit: 2017-12-30 | Discharge: 2017-12-30 | Disposition: A | Discharge status: Home / Self Care | Payer: Managed Care, Other (non HMO) | Source: Home / Self Care

## 2017-12-30 DIAGNOSIS — L03311 Cellulitis of abdominal wall: Secondary | ICD-10-CM

## 2017-12-30 DIAGNOSIS — L0291 Cutaneous abscess, unspecified: Secondary | ICD-10-CM

## 2017-12-30 MED ORDER — DOXYCYCLINE HYCLATE 100 MG PO TABS: 100.0000 mg | ORAL_TABLET | Freq: Two times a day (BID) | ORAL | 0 refills | Status: DC

## 2017-12-30 NOTE — ED Provider Notes (Signed)
  [facility]    CSN: [id number] Arrival date & time: 12/30/17  1538  Subjective:    CC: Skin abscess  HPI: This is a pleasant 57 year old male, history of diabetes mellitus type 2 relatively well controlled, for the past several days he has had an increasing painful mass on his left lower abdomen, he has had abscesses in the past.  Symptoms are moderate, persistent.  Localized without radiation.  I reviewed the past medical history, family history, social history, surgical history, and allergies today and no changes were needed.  Please see the problem list section below in epic for further details.  Past Medical History: Past Medical History:  Diagnosis Date  . Diabetes mellitus (HCC)   . Diabetes type 2, controlled (HCC) 10/19/2015   Past Surgical History: Past Surgical History:  Procedure Laterality Date  . ABCESS DRAINAGE     Nipple  . ROTATOR CUFF REPAIR Right    Social History: Social History   Socioeconomic History  . Marital status: Married    Spouse name: None  . Number of children: None  . Years of education: None  . Highest education level: None  Social Needs  . Financial resource strain: None  . Food insecurity - worry: None  . Food insecurity - inability: None  . Transportation needs - medical: None  . Transportation needs - non-medical: None  Occupational History  . None  Tobacco Use  . Smoking status: Current Every Day Smoker    Packs/day: 1.00    Years: 30.00    Pack years: 30.00    Types: Cigarettes  . Smokeless tobacco: Never Used  Substance and Sexual Activity  . Alcohol use: Yes    Alcohol/week: 0.0 oz  . Drug use: No  . Sexual activity: Not Currently    Partners: Female  Other Topics Concern  . None  Social History Narrative  . None   Family History: Family History  Problem Relation Age of Onset  . Bladder Cancer Father   . Skin cancer Father   . Diabetes Father   . Diabetes Mother    Allergies: Allergies    Allergen Reactions  . Meloxicam Other (See Comments)    headache   Medications: See med rec.  Review of Systems: No fevers, chills, night sweats, weight loss, chest pain, or shortness of breath.   Objective:    General: Well Developed, well nourished, and in no acute distress.  Neuro: Alert and oriented x3, extra-ocular muscles intact, sensation grossly intact.  HEENT: Normocephalic, atraumatic, pupils equal round reactive to light, neck supple, no masses, no lymphadenopathy, thyroid nonpalpable.  Skin: Warm and dry, no rashes.  There is a 3-4 cm area of erythema, induration on his left lower abdomen, no palpable area of fluctuance. Cardiac: Regular rate and rhythm, no murmurs rubs or gallops, no lower extremity edema.  Respiratory: Clear to auscultation bilaterally. Not using accessory muscles, speaking in full sentences.  Impression and Recommendations:    Skin abscess: No fluctuant lesion to drain, we are going to do doxycycline, he will continue aggressive warm compresses and follow-up with his PCP in a week. ___________________________________________ Ihor Austinhomas J. Benjamin Stainhekkekandam, M.D., ABFM., CAQSM. Primary Care and Sports Medicine [place]  Adjunct Instructor of Family Medicine  [employer]    Monica Bectonhekkekandam, Aven Cegielski J, MD 12/30/17 [phone]

## 2017-12-30 NOTE — ED Triage Notes (Signed)
Pt has had a sore on left abdomen.  Red, inflamed, and very painful.  Pt said he has not slept in 3 nights due to pain.

## 2017-12-31 LAB — HEMOGLOBIN A1C: HEMOGLOBIN A1C: 7.5

## 2018-01-03 ENCOUNTER — Encounter: Payer: Self-pay | Admitting: Osteopathic Medicine

## 2018-01-28 ENCOUNTER — Encounter: Payer: Self-pay | Admitting: Osteopathic Medicine

## 2018-01-28 ENCOUNTER — Ambulatory Visit (INDEPENDENT_AMBULATORY_CARE_PROVIDER_SITE_OTHER): Payer: Managed Care, Other (non HMO) | Admitting: Osteopathic Medicine

## 2018-01-28 VITALS — BP 155/71 | HR 71 | Temp 98.0°F | Wt 224.0 lb

## 2018-01-28 DIAGNOSIS — L02211 Cutaneous abscess of abdominal wall: Secondary | ICD-10-CM

## 2018-01-28 MED ORDER — METRONIDAZOLE 500 MG PO TABS: 500.0000 mg | ORAL_TABLET | Freq: Two times a day (BID) | ORAL | 0 refills | Status: AC

## 2018-01-28 MED ORDER — CIPROFLOXACIN HCL 500 MG PO TABS: 500.0000 mg | ORAL_TABLET | Freq: Two times a day (BID) | ORAL | 0 refills | Status: DC

## 2018-01-28 MED ORDER — DOXYCYCLINE HYCLATE 100 MG PO TABS: 100.0000 mg | ORAL_TABLET | Freq: Two times a day (BID) | ORAL | 0 refills | Status: DC

## 2018-01-28 NOTE — Progress Notes (Signed)
HPI: [facility] is a 57 y.o. male who  has a past medical history of Diabetes mellitus (HCC) and Diabetes type 2, controlled (HCC) (10/19/2015).  he presents to [facility] today, 01/28/18,  for chief complaint of:  Skin concern   Records reviewed from urgent care visit 12/30/2017. Seen for abscess on left lower abdomen, no fluctuant lesion to drain, trial doxycycline and warm compresses, advise follow-up with PCP in one week. He is here about a month later, abx and compresses helped a bit but the skin was still a bit tense, and over past few days has developed a sharp tenderness     Past medical history, surgical history, social history and family history reviewed.   Current medication list and allergy/intolerance information reviewed.    Current Outpatient Medications on File Prior to Visit  Medication Sig Dispense Refill  . celecoxib (CELEBREX) 100 MG capsule Take 1 capsule (100 mg total) 2 (two) times daily by mouth. 60 capsule 1  . cyclobenzaprine (FLEXERIL) 10 MG tablet Take 0.5-1 tablets (5-10 mg total) 3 (three) times daily as needed by mouth. 30 tablet 1  . diclofenac sodium (VOLTAREN) 1 % GEL Apply 2-4 g 4 (four) times daily topically. To affected joint/muscle 100 g 11  . enalapril (VASOTEC) 20 MG tablet Take 2 tablets (40 mg total) daily by mouth. 180 tablet 1  . glipiZIDE (GLUCOTROL) 5 MG tablet Take 1 tablet (5 mg total) by mouth 2 (two) times daily before a meal. 180 tablet 3  . metFORMIN (GLUCOPHAGE) 1000 MG tablet Take 1 tablet (1,000 mg total) by mouth 2 (two) times daily with a meal. 180 tablet 3   No current facility-administered medications on file prior to visit.    Allergies  Allergen Reactions  . Meloxicam Other (See Comments)    headache      Review of Systems:  Constitutional: No recent illness other than skin issue as per HPI   HEENT: No  headache  Cardiac: No  chest pain  Respiratory:  No  shortness of breath.    Gastrointestinal: No  abdominal pain except on skin  Musculoskeletal: No new myalgia/arthralgia  Skin: +Rash   Exam:  BP (!) 155/71   Pulse 71   Temp 98 F (36.7 C) (Oral)   Wt 244 lb 1.9 oz (110.7 kg)   BMI 35.03 kg/m   Constitutional: VS see above. General Appearance: alert, well-developed, well-nourished, NAD  Eyes: Normal lids and conjunctive, non-icteric sclera  Ears, Nose, Mouth, Throat: MMM, Normal external inspection ears/nares/mouth/lips/gums.  Neck: No masses, trachea midline.   Respiratory: Normal respiratory effort. no wheeze, no rhonchi, no rales  Cardiovascular: S1/S2 normal, no murmur, no rub/gallop auscultated. RRR.   Musculoskeletal: Gait normal. Symmetric and independent movement of all extremities  Neurological: Normal balance/coordination. No tremor.  Skin: warm, dry, intact. L lower abdomen area of what feels like subq edema with marked tenderness on palpation of small area cenrtally. No appreciable fluctuance. No significant superficial erythema.   Psychiatric: Normal judgment/insight. Normal mood and affect. Oriented x3.     ASSESSMENT/PLAN:   Cutaneous abscess of abdominal wall - nothing to I&D, trial broader coverage abx, appreciate Dr T taking a look to compare to previous - he states it looks a bit better. I may be out of the office next few weeks, so pt advised f/u w/ Dr T if worse     Meds ordered this encounter  Medications  . doxycycline (VIBRA-TABS) 100 MG tablet  Sig: Take 1 tablet (100 mg total) by mouth 2 (two) times daily.    Dispense:  20 tablet    Refill:  0  . ciprofloxacin (CIPRO) 500 MG tablet    Sig: Take 1 tablet (500 mg total) by mouth 2 (two) times daily.    Dispense:  20 tablet    Refill:  0  . metroNIDAZOLE (FLAGYL) 500 MG tablet    Sig: Take 1 tablet (500 mg total) by mouth 2 (two) times daily for 10 days.    Dispense:  20 tablet    Refill:  0      Follow-up plan: Return for recheck skin with Dr T if  needed.   Visit summary with medication list and pertinent instructions was printed for patient to review, alert us if any changes needed. All questions at time of visit were answered - patient instructed to contact office with any additional concerns. ER/RTC precautions were reviewed with the patient and understanding verbalized.     Please note: voice recognition software was used to produce this document, and typos may escape review. Please contact Dr. Lyn HollingsheadAlexander for any needed clarifications.

## 2018-03-04 ENCOUNTER — Emergency Department (INDEPENDENT_AMBULATORY_CARE_PROVIDER_SITE_OTHER)
Admission: EM | Admit: 2018-03-04 | Discharge: 2018-03-04 | Disposition: A | Discharge status: Home / Self Care | Payer: PRIVATE HEALTH INSURANCE | Source: Home / Self Care | Attending: Family Medicine | Admitting: Family Medicine

## 2018-03-04 ENCOUNTER — Encounter: Payer: Self-pay | Admitting: *Deleted

## 2018-03-04 ENCOUNTER — Other Ambulatory Visit: Payer: Self-pay

## 2018-03-04 ENCOUNTER — Emergency Department (INDEPENDENT_AMBULATORY_CARE_PROVIDER_SITE_OTHER): Payer: PRIVATE HEALTH INSURANCE

## 2018-03-04 DIAGNOSIS — R05 Cough: Secondary | ICD-10-CM | POA: Diagnosis not present

## 2018-03-04 DIAGNOSIS — B9789 Other viral agents as the cause of diseases classified elsewhere: Secondary | ICD-10-CM

## 2018-03-04 DIAGNOSIS — J069 Acute upper respiratory infection, unspecified: Secondary | ICD-10-CM

## 2018-03-04 DIAGNOSIS — R509 Fever, unspecified: Secondary | ICD-10-CM

## 2018-03-04 LAB — POCT INFLUENZA A/B
Influenza A, POC: NEGATIVE
Influenza B, POC: NEGATIVE

## 2018-03-04 MED ORDER — BENZONATATE 200 MG PO CAPS: ORAL_CAPSULE | ORAL | 0 refills | Status: DC

## 2018-03-04 MED ORDER — DOXYCYCLINE HYCLATE 100 MG PO CAPS: 100.0000 mg | ORAL_CAPSULE | Freq: Two times a day (BID) | ORAL | 0 refills | Status: DC

## 2018-03-04 MED ORDER — PREDNISONE 20 MG PO TABS: ORAL_TABLET | ORAL | 0 refills | Status: DC

## 2018-03-04 NOTE — Discharge Instructions (Addendum)
Take plain guaifenesin (1200mg  extended release tabs such as Mucinex) twice daily, with plenty of water, for cough and congestion.  Get adequate rest.   May use Afrin nasal spray (or generic oxymetazoline) each morning for about 5 days and then discontinue.  Also recommend using saline nasal spray several times daily and saline nasal irrigation (AYR is a common brand).  Use Flonase nasal spray each morning after using Afrin nasal spray and saline nasal irrigation. Try warm salt water gargles for sore throat.  Stop all antihistamines for now, and other non-prescription cough/cold preparations. Continue albuterol inhaler as needed. May take Delsym Cough Suppressant with Tessalon at bedtime for nighttime cough.

## 2018-03-04 NOTE — ED Provider Notes (Signed)
[facility]    CSN: [id number] Arrival date & time: 03/04/18  1725     History   Chief Complaint Chief Complaint  Patient presents with  . Cough    HPI [facility] is a 57 y.o. male.   Patient awoke yesterday with a cough and sinus congestion.  He later developed myalgias and headache.  He has become increasingly fatigued today with tightness in his anterior chest and intermittent wheezing, improved with his albuterol inhaler.  He has had chills/sweats and low grade fever. He has a history of mild asthma, and continues to smoke.  The history is provided by the patient.    Past Medical History:  Diagnosis Date  . Diabetes mellitus (HCC)   . Diabetes type 2, controlled (HCC) 10/19/2015    Patient Active Problem List   Diagnosis Date Noted  . Skin abscess 12/30/2017  . Chronic bilateral low back pain without sciatica 10/03/2017  . Essential hypertension 10/02/2017  . Colon polyps 04/17/2017  . Skin lesion of scalp 03/07/2016  . Skin lesion 03/07/2016  . Abnormal testicular exam 03/07/2016  . Type 2 diabetes mellitus without complication (HCC) 03/07/2016  . Thrombocytopenia (HCC) 03/07/2016  . Trigger finger, left 10/19/2015  . Trigger finger, right 10/19/2015    Past Surgical History:  Procedure Laterality Date  . ABCESS DRAINAGE     Nipple  . CHOLECYSTECTOMY    . ROTATOR CUFF REPAIR Right   . TONSILLECTOMY         Home Medications    Prior to Admission medications   Medication Sig Start Date End Date Taking? Authorizing Provider  enalapril (VASOTEC) 20 MG tablet Take 2 tablets (40 mg total) daily by mouth. 10/02/17  Yes Sunnie Nielsen, DO  glipiZIDE (GLUCOTROL) 5 MG tablet Take 1 tablet (5 mg total) by mouth 2 (two) times daily before a meal. 02/20/17  Yes Sunnie Nielsen, DO  metFORMIN (GLUCOPHAGE) 1000 MG tablet Take 1 tablet (1,000 mg total) by mouth 2 (two) times daily with a meal. 02/20/17  Yes Sunnie Nielsen, DO  benzonatate  (TESSALON) 200 MG capsule Take one cap by mouth at bedtime as needed for cough.  May repeat in 4 to 6 hours 03/04/18   Lattie Haw, MD  doxycycline (VIBRAMYCIN) 100 MG capsule Take 1 capsule (100 mg total) by mouth 2 (two) times daily. Take with food. 03/04/18   Lattie Haw, MD  predniSONE (DELTASONE) 20 MG tablet Take one tab by mouth twice daily for 4 days, then one daily. Take with food. 03/04/18   Lattie Haw, MD    Family History Family History  Problem Relation Age of Onset  . Bladder Cancer Father   . Skin cancer Father   . Diabetes Father   . Diabetes Mother     Social History Social History   Tobacco Use  . Smoking status: Current Every Day Smoker    Packs/day: 1.00    Years: 30.00    Pack years: 30.00    Types: Cigarettes  . Smokeless tobacco: Never Used  Substance Use Topics  . Alcohol use: Yes    Alcohol/week: 0.0 oz  . Drug use: No     Allergies   Meloxicam   Review of Systems Review of Systems No sore throat + cough No pleuritic pain, but has tightness in his anterior chest. + wheezing + nasal congestion + post-nasal drainage No sinus pain/pressure No itchy/red eyes No earache No hemoptysis + SOB + fever, + chills No  nausea No vomiting No abdominal pain No diarrhea No urinary symptoms No skin rash + fatigue + myalgias + headache Used OTC meds without relief   Physical Exam Triage Vital Signs ED Triage Vitals  Enc Vitals Group     BP 03/04/18 1742 (!) 156/77     Pulse Rate 03/04/18 1742 98     Resp 03/04/18 1742 18     Temp 03/04/18 1742 99.5 F (37.5 C)     Temp Source 03/04/18 1742 Oral     SpO2 03/04/18 1742 98 %     Weight 03/04/18 1743 225 lb (102.1 kg)     Height 03/04/18 1743 5\' 10"  (1.778 m)     Head Circumference --      Peak Flow --      Pain Score 03/04/18 1743 0     Pain Loc --      Pain Edu? --      Excl. in GC? --    No data found.  Updated Vital Signs BP (!) 156/77 (BP Location: Right Arm)   Pulse  98   Temp 99.5 F (37.5 C) (Oral)   Resp 18   Ht 5\' 10"  (1.778 m)   Wt 225 lb (102.1 kg)   SpO2 98%   BMI 32.28 kg/m   Visual Acuity Right Eye Distance:   Left Eye Distance:   Bilateral Distance:    Right Eye Near:   Left Eye Near:    Bilateral Near:     Physical Exam Nursing notes and Vital Signs reviewed. Appearance:  Patient appears stated age, and in no acute distress Eyes:  Pupils are equal, round, and reactive to light and accomodation.  Extraocular movement is intact.  Conjunctivae are not inflamed  Ears:  Canals normal.  Tympanic membranes normal.  Nose:  Mildly congested turbinates.  No sinus tenderness.  Pharynx:  Normal Neck:  Supple.  Enlarged nontender posterior/lateral nodes are palpated bilaterally. Lungs:  Diffuse bilateral faint rhonchi and expiratory wheezes.  Breath sounds are equal.  Moving air well. Heart:  Regular rate and rhythm without murmurs, rubs, or gallops.  Rate 100 Abdomen:  Nontender without masses or hepatosplenomegaly.  Bowel sounds are present.  No CVA or flank tenderness.  Extremities:  No edema.  Skin:  No rash present.    UC Treatments / Results  Labs (all labs ordered are listed, but only abnormal results are displayed) Labs Reviewed  POCT INFLUENZA A/B negative    EKG None Radiology Dg Chest 2 View  Result Date: 03/04/2018 CLINICAL DATA:  Cough for 1 day with fever and chest tightness. EXAM: CHEST - 2 VIEW COMPARISON:  03/05/2017 FINDINGS: The heart size and mediastinal contours are within normal limits. Both lungs are clear. The visualized skeletal structures are unremarkable. IMPRESSION: No active cardiopulmonary disease. Electronically Signed   By: Tollie Ethavid  Kwon M.D.   On: 03/04/2018 18:12    Procedures Procedures (including critical care time)  Medications Ordered in UC Medications - No data to display   Initial Impression / Assessment and Plan / UC Course  I have reviewed the triage vital signs and the nursing  notes.  Pertinent labs & imaging results that were available during my care of the patient were reviewed by me and considered in my medical decision making (see chart for details).    Begin empiric doxycycline.  Because of his history of asthma and wheezing on exam, will begin prednisone burst/taper. Prescription written for Benzonatate [facility](Tessalon) to take at bedtime for  night-time cough.  Take plain guaifenesin (1200mg  extended release tabs such as Mucinex) twice daily, with plenty of water, for cough and congestion.  Get adequate rest.   May use Afrin nasal spray (or generic oxymetazoline) each morning for about 5 days and then discontinue.  Also recommend using saline nasal spray several times daily and saline nasal irrigation (AYR is a common brand).  Use Flonase nasal spray each morning after using Afrin nasal spray and saline nasal irrigation. Try warm salt water gargles for sore throat.  Stop all antihistamines for now, and other non-prescription cough/cold preparations. Continue albuterol inhaler as needed. May take Delsym Cough Suppressant with Tessalon at bedtime for nighttime cough.  Followup with Family Doctor if not improved in one week.     Final Clinical Impressions(s) / UC Diagnoses   Final diagnoses:  Viral URI with cough    ED Discharge Orders        Ordered    predniSONE (DELTASONE) 20 MG tablet     03/04/18 1914    doxycycline (VIBRAMYCIN) 100 MG capsule  2 times daily     03/04/18 1914    benzonatate (TESSALON) 200 MG capsule     03/04/18 1914          Lattie Haw, MD 03/05/18 1305

## 2018-03-04 NOTE — ED Triage Notes (Signed)
Pt c/o productive cough x 1 day. Denies fever. He has taken Mucinex and IBF.

## 2018-03-10 ENCOUNTER — Other Ambulatory Visit: Payer: Self-pay | Admitting: Osteopathic Medicine

## 2018-03-10 DIAGNOSIS — E119 Type 2 diabetes mellitus without complications: Secondary | ICD-10-CM

## 2018-04-08 LAB — HEMOGLOBIN A1C: Hemoglobin A1C: 6.8

## 2018-04-16 ENCOUNTER — Encounter: Payer: Self-pay | Admitting: Osteopathic Medicine

## 2018-05-03 IMAGING — DX DG CHEST 2V
2 series · 2 of 2 positions shown · non-contrast
Comparison: 03/05/2017

CLINICAL DATA: Cough for 1 day with fever and chest tightness.

EXAM:
CHEST - 2 VIEW

[chest pa]
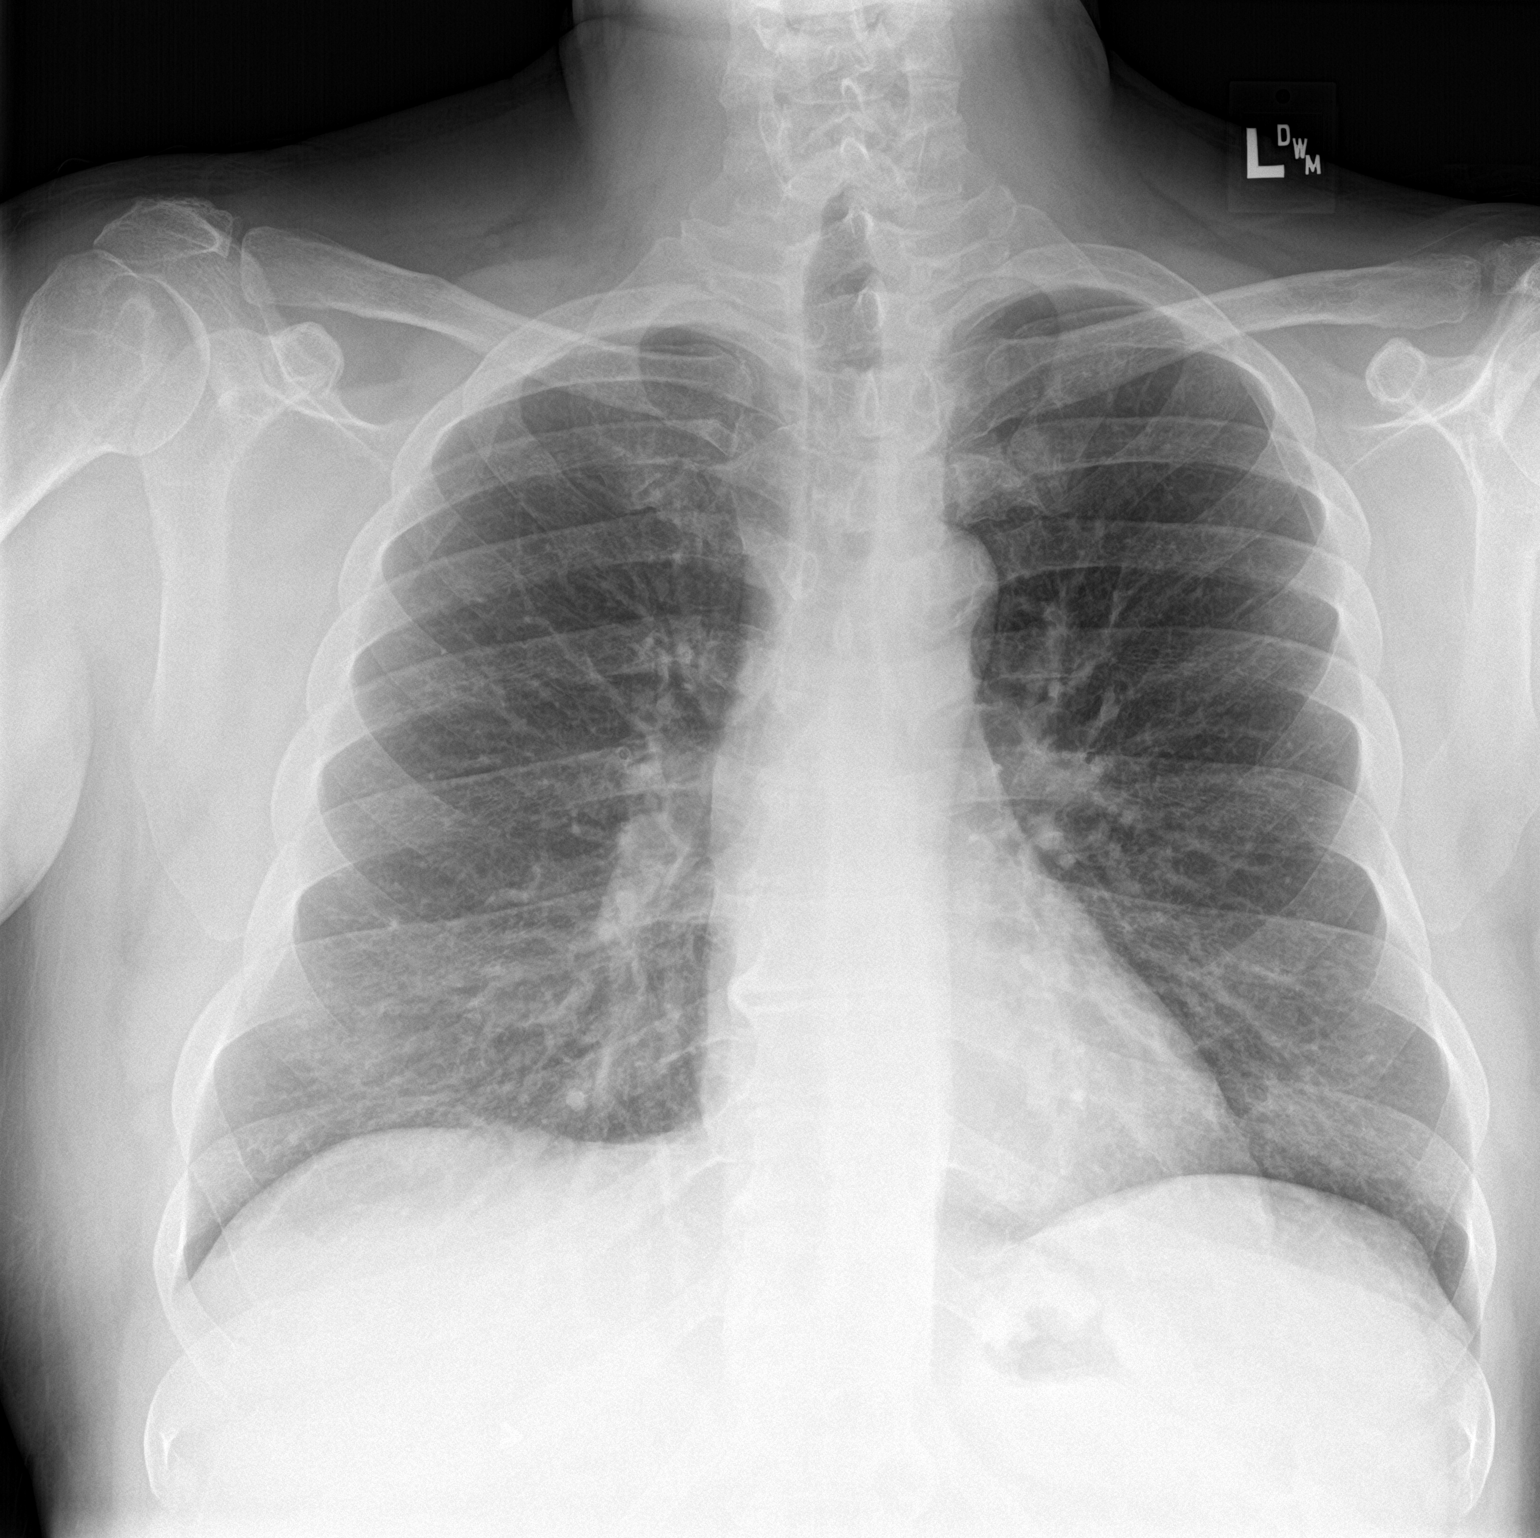

[chest lat]
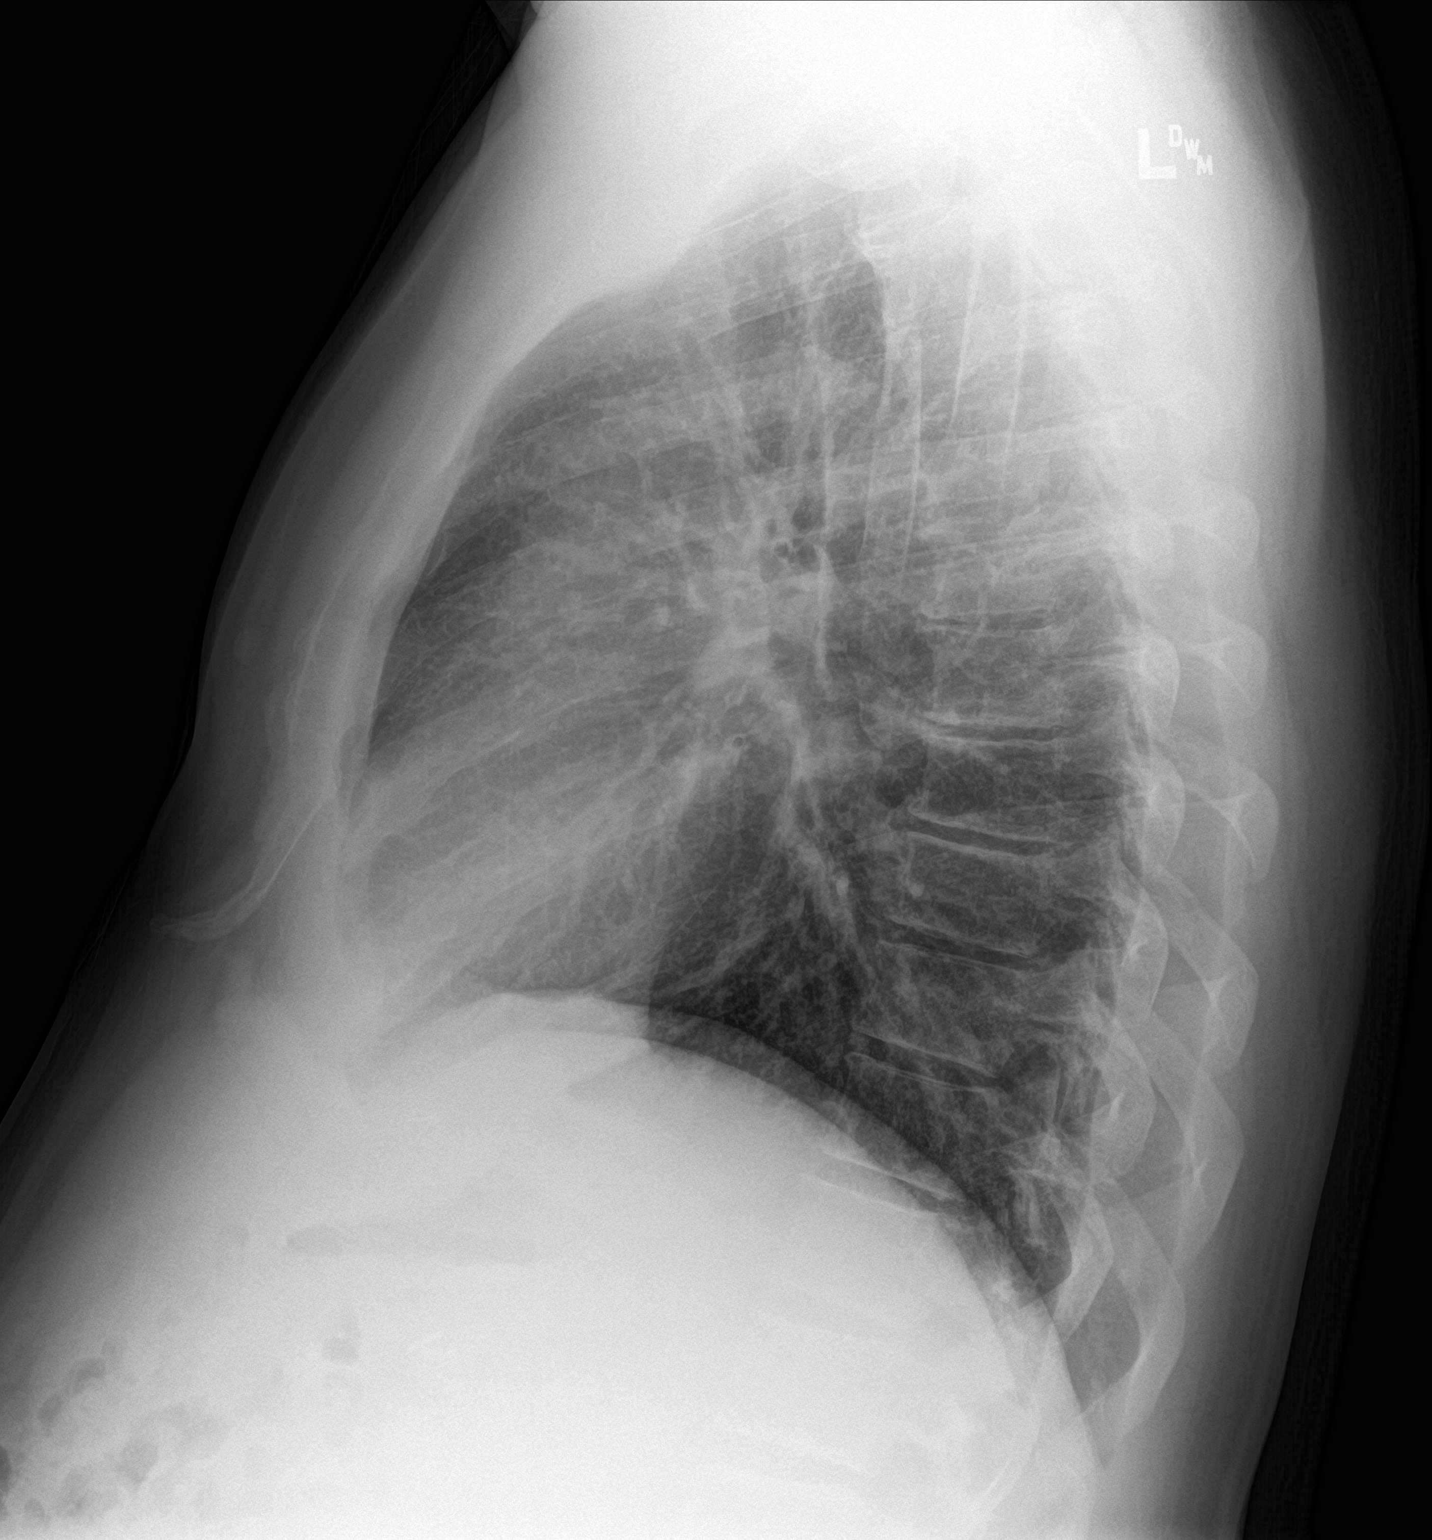

[2 of 2 positions shown; findings below may reference images not displayed]

FINDINGS: The heart size and mediastinal contours are within normal limits.
Both lungs are clear. The visualized skeletal structures are
unremarkable.
IMPRESSION: No active cardiopulmonary disease.

## 2018-05-15 ENCOUNTER — Other Ambulatory Visit: Payer: Self-pay | Admitting: Osteopathic Medicine

## 2018-05-15 DIAGNOSIS — E119 Type 2 diabetes mellitus without complications: Secondary | ICD-10-CM

## 2018-05-15 NOTE — Telephone Encounter (Signed)
[facility] pharmacy requesting med RF for metformin. Pls advise, if request appropriate.

## 2018-05-15 NOTE — Telephone Encounter (Signed)
Left pt a brief vm msg re: med RF.

## 2018-05-22 ENCOUNTER — Ambulatory Visit (INDEPENDENT_AMBULATORY_CARE_PROVIDER_SITE_OTHER): Payer: PRIVATE HEALTH INSURANCE

## 2018-05-22 ENCOUNTER — Encounter: Payer: Self-pay | Admitting: Osteopathic Medicine

## 2018-05-22 ENCOUNTER — Ambulatory Visit (INDEPENDENT_AMBULATORY_CARE_PROVIDER_SITE_OTHER): Payer: PRIVATE HEALTH INSURANCE | Admitting: Osteopathic Medicine

## 2018-05-22 VITALS — BP 167/78 | HR 80 | Temp 98.2°F | Wt 222.6 lb

## 2018-05-22 DIAGNOSIS — M545 Low back pain, unspecified: Secondary | ICD-10-CM

## 2018-05-22 DIAGNOSIS — I10 Essential (primary) hypertension: Secondary | ICD-10-CM | POA: Diagnosis not present

## 2018-05-22 DIAGNOSIS — D72819 Decreased white blood cell count, unspecified: Secondary | ICD-10-CM | POA: Diagnosis not present

## 2018-05-22 DIAGNOSIS — D696 Thrombocytopenia, unspecified: Secondary | ICD-10-CM | POA: Diagnosis not present

## 2018-05-22 DIAGNOSIS — E119 Type 2 diabetes mellitus without complications: Secondary | ICD-10-CM

## 2018-05-22 DIAGNOSIS — M47894 Other spondylosis, thoracic region: Secondary | ICD-10-CM

## 2018-05-22 DIAGNOSIS — G8929 Other chronic pain: Secondary | ICD-10-CM | POA: Diagnosis not present

## 2018-05-22 DIAGNOSIS — I700 Atherosclerosis of aorta: Secondary | ICD-10-CM | POA: Diagnosis not present

## 2018-05-22 DIAGNOSIS — S39012A Strain of muscle, fascia and tendon of lower back, initial encounter: Secondary | ICD-10-CM

## 2018-05-22 DIAGNOSIS — L03818 Cellulitis of other sites: Secondary | ICD-10-CM

## 2018-05-22 DIAGNOSIS — Z125 Encounter for screening for malignant neoplasm of prostate: Secondary | ICD-10-CM

## 2018-05-22 DIAGNOSIS — Z23 Encounter for immunization: Secondary | ICD-10-CM

## 2018-05-22 LAB — POCT URINALYSIS DIPSTICK
GLUCOSE UA: POSITIVE — AB
Leukocytes, UA: NEGATIVE
NITRITE UA: NEGATIVE
Protein, UA: NEGATIVE
RBC UA: NEGATIVE
Spec Grav, UA: >=1.03 — AB (ref 1.010–1.025)
UROBILINOGEN UA: 4 U/dL — AB
pH, UA: 5.5 (ref 5.0–8.0)

## 2018-05-22 MED ORDER — CYCLOBENZAPRINE HCL 10 MG PO TABS: 5.0000 mg | ORAL_TABLET | Freq: Three times a day (TID) | ORAL | 1 refills | Status: DC | PRN

## 2018-05-22 MED ORDER — NAPROXEN 500 MG PO TABS
500.0000 mg | ORAL_TABLET | Freq: Two times a day (BID) | ORAL | 1 refills | Status: DC
Start: 2018-05-22 — End: 2020-01-07

## 2018-05-22 MED ORDER — SULFAMETHOXAZOLE-TRIMETHOPRIM 800-160 MG PO TABS: 1.0000 | ORAL_TABLET | Freq: Two times a day (BID) | ORAL | 0 refills | Status: DC

## 2018-05-22 NOTE — Progress Notes (Addendum)
HPI: Jeffrey Nichols is a 57 y.o. male who  has a past medical history of Diabetes mellitus (HCC) and Diabetes type 2, controlled (HCC) (10/19/2015).  he presents to [facility] today, 05/22/18,  for chief complaint of:  Lower back pain, trouble urinating  Lower back pain  Context: no known injury, hx occasional LBP, seen for similar 07/2017. He's worried about kidney pain today  Location: lower back bilaterally but a bit worse on the left side  Quality: sore, aching, feels like a tight muscle  Duration: few months   Timing: constant, worse with twisting  Assoc signs/symptoms: no bowel or bladder incontinence, no numbness/electrical shooting type pain, no leg weakness  Concern about sore boil in the groin area on the left, would like this looked at.  Similar problem in this area a few months ago, he drained it at home on his own  Hypertension: Blood pressure elevated today, no chest pain, pressure, shortness of breath, headache or dizziness.  Reports compliance with medications as below     Past medical history, surgical history, and family history reviewed.  Current medication list and allergy/intolerance information reviewed.   (See remainder of HPI, ROS, Phys Exam below)  Dg Lumbar Spine 2-3 Views  Result Date: 05/22/2018 CLINICAL DATA:  Midline lower back pain for 2 weeks EXAM: LUMBAR SPINE - 2-3 VIEW COMPARISON:  None. FINDINGS: Lower thoracic disc narrowing and mild endplate ridging. Lumbar disc height is comparatively well maintained. No definite facet spurring. Normal alignment. No evidence of fracture or bone lesion. Atherosclerotic calcification of the aorta. IMPRESSION: 1. No acute finding. 2. Lower thoracic spondylosis. 3.  Aortic Atherosclerosis (ICD10-I70.0). Electronically Signed   By: Marnee Spring M.D.   On: 05/22/2018 16:06    Results for orders placed or performed in visit on 05/22/18 (from the past 72 hour(s))  POCT  Urinalysis Dipstick     Status: Abnormal   Collection Time: 05/22/18 10:57 AM  Result Value Ref Range   Color, UA yellow    Clarity, UA clear    Glucose, UA Positive (A) Negative   Bilirubin, UA small    Ketones, UA trace    Spec Grav, UA >=1.030 (A) 1.010 - 1.025   Blood, UA negative    pH, UA 5.5 5.0 - 8.0   Protein, UA Negative Negative   Urobilinogen, UA 4.0 (A) 0.2 or 1.0 E.U./dL   Nitrite, UA negative    Leukocytes, UA Negative Negative   Appearance     Odor       ASSESSMENT/PLAN:   Strain of lumbar region, initial encounter - Plan: POCT Urinalysis Dipstick, Urinalysis, microscopic only, Urine Culture  Chronic left-sided low back pain without sciatica - Plan: DG Lumbar Spine 2-3 Views, cyclobenzaprine (FLEXERIL) 10 MG tablet, naproxen (NAPROSYN) 500 MG tablet, Ambulatory referral to Physical Therapy  Essential hypertension - Plan: CBC with Differential/Platelet, COMPLETE METABOLIC PANEL WITH GFR, Lipid panel  Type 2 diabetes mellitus without complication, without long-term current use of insulin (HCC) - Plan: CBC with Differential/Platelet, COMPLETE METABOLIC PANEL WITH GFR, Lipid panel  Cellulitis of other specified site - Plan: sulfamethoxazole-trimethoprim (BACTRIM DS,SEPTRA DS) 800-160 MG tablet  Prostate cancer screening - Plan: PSA, Total with Reflex to PSA, Free  Need for Tdap vaccination - Plan: Tdap vaccine greater than or equal to 7yo IM  Abdominal aortic atherosclerosis (HCC)    Meds ordered this encounter  Medications  . cyclobenzaprine (FLEXERIL) 10 MG tablet    Sig: Take 0.5-1 tablets (  5-10 mg total) by mouth 3 (three) times daily as needed for muscle spasms. Caution: can cause drowsiness    Dispense:  60 tablet    Refill:  1  . naproxen (NAPROSYN) 500 MG tablet    Sig: Take 1 tablet (500 mg total) by mouth 2 (two) times daily with a meal. Take every day for one week, then use as needed after that    Dispense:  60 tablet    Refill:  1  .  sulfamethoxazole-trimethoprim (BACTRIM DS,SEPTRA DS) 800-160 MG tablet    Sig: Take 1 tablet by mouth 2 (two) times daily.    Dispense:  14 tablet    Refill:  0      Patient Instructions  Plan: back   Xray today  See printed home exercises for back pain   Try muscle relaxer (cyclobenzaprine) prior to bed or as needed  Try anti-inflammatory (naproxen) twice a day for one week, then as-needed after that  Let me know if you'd like a steroid burst  Referral to physical therapy  If no better, would recommend follow-up with Dr T for this and other arthritic/orthopedic symptoms   Plan: skin  Looks like early infection  Antibiotics sent (bactrim)   Continue warm compresses few times per day   If it looks ready to burst, come see [place] or urgent care so we can obtain a culture of the discharge and ensure adequate treatment   Plan: diabetes  Continue current meds  Optimize diet/exercise  Will consider increasing Glipizide if A1C doesn't improve  Plan: blood pressure    Let's plan to follow-up in one week to recheck blood pressure at nurse visit   Routine labs also obtained today, return for annual physical in 3 months    Follow-up plan: Return in about 1 week (around 05/29/2018) for nurse visit to recheck BP .     ############################################ ############################################ ############################################ ############################################     Allergies  Allergen Reactions  . Meloxicam Other (See Comments)    headache      Review of Systems:  Constitutional: No recent illness  HEENT: No  headache, no vision change  Cardiac: No  chest pain, No  pressure, No palpitations  Respiratory:  No  shortness of breath. No  Cough  Gastrointestinal: No  abdominal pain, no change on bowel habits  Musculoskeletal: +myalgia/arthralgia  Skin: +Rash  Hem/Onc: No  easy bruising/bleeding, No  abnormal  lumps/bumps  Neurologic: No  weakness, No  Dizziness  Psychiatric: No  concerns with depression, No  concerns with anxiety  Exam:  BP (!) 167/78 (BP Location: Left Arm, Patient Position: Sitting, Cuff Size: Normal)   Pulse 80   Temp 98.2 F (36.8 C) (Oral)   Wt 222 lb 9.6 oz (101 kg)   BMI 31.94 kg/m   Constitutional: VS see above. General Appearance: alert, well-developed, well-nourished, NAD  Eyes: Normal lids and conjunctive, non-icteric sclera  Ears, Nose, Mouth, Throat: MMM, Normal external inspection ears/nares/mouth/lips/gums.  Neck: No masses, trachea midline.   Respiratory: Normal respiratory effort. no wheeze, no rhonchi, no rales  Cardiovascular: S1/S2 normal, no murmur, no rub/gallop auscultated. RRR.   Musculoskeletal: Gait normal. Symmetric and independent movement of all extremities.  Positive left lumbar quadratus lumborum muscle tightness, negative straight leg raise  Neurological: Normal balance/coordination. No tremor.  Skin: warm, dry, intact.  Mild erythema without fluctuance/palpable abscess in left inguinal region  Psychiatric: Normal judgment/insight. Normal mood and affect. Oriented x3.   Visit summary with medication list and  pertinent instructions was printed for patient to review, advised to alert [place] if any changes needed. All questions at time of visit were answered - patient instructed to contact office with any additional concerns. ER/RTC precautions were reviewed with the patient and understanding verbalized.   Follow-up plan: Return in about 1 week (around 05/29/2018) for nurse visit to recheck BP .  Note: Total time spent 25 minutes, greater than 50% of the visit was spent face-to-face counseling and coordinating care for the following: The primary encounter diagnosis was Strain of lumbar region, initial encounter. Diagnoses of Chronic left-sided low back pain without sciatica, Essential hypertension, Type 2 diabetes mellitus without  complication, without long-term current use of insulin (HCC), Cellulitis of other specified site, Prostate cancer screening, Need for Tdap vaccination, and Abdominal aortic atherosclerosis (HCC) were also pertinent to this visit.Marland Kitchen  Please note: voice recognition software was used to produce this document, and typos may escape review. Please contact Dr. Lyn Hollingshead for any needed clarifications.                 ADDENDUM 05/23/18 10:55 AM  Results for orders placed or performed in visit on 05/22/18 (from the past 72 hour(s))  Urinalysis, microscopic only     Status: None   Collection Time: 05/22/18 10:38 AM  Result Value Ref Range   WBC, UA NONE SEEN 0 - 5 /HPF   RBC / HPF NONE SEEN 0 - 2 /HPF   Squamous Epithelial / LPF NONE SEEN < OR = 5 /HPF   Bacteria, UA NONE SEEN NONE SEEN /HPF   Hyaline Cast NONE SEEN NONE SEEN /LPF  POCT Urinalysis Dipstick     Status: Abnormal   Collection Time: 05/22/18 10:57 AM  Result Value Ref Range   Color, UA yellow    Clarity, UA clear    Glucose, UA Positive (A) Negative   Bilirubin, UA small    Ketones, UA trace    Spec Grav, UA >=1.030 (A) 1.010 - 1.025   Blood, UA negative    pH, UA 5.5 5.0 - 8.0   Protein, UA Negative Negative   Urobilinogen, UA 4.0 (A) 0.2 or 1.0 E.U./dL   Nitrite, UA negative    Leukocytes, UA Negative Negative   Appearance     Odor    CBC with Differential/Platelet     Status: Abnormal   Collection Time: 05/22/18 11:22 AM  Result Value Ref Range   WBC 3.3 (L) 3.8 - 10.8 Thousand/uL   RBC 4.60 4.20 - 5.80 Million/uL   Hemoglobin 14.0 13.2 - 17.1 g/dL   HCT 16.1 09.6 - 04.5 %   MCV 88.0 80.0 - 100.0 fL   MCH 30.4 27.0 - 33.0 pg   MCHC 34.6 32.0 - 36.0 g/dL   RDW 40.9 81.1 - 91.4 %   Platelets 85 (L) 140 - 400 Thousand/uL   MPV 11.0 7.5 - 12.5 fL   Neutro Abs 2,072 1,500 - 7,800 cells/uL   Lymphs Abs 832 (L) 850 - 3,900 cells/uL   WBC mixed population 264 200 - 950 cells/uL   Eosinophils Absolute 102 15 -  500 cells/uL   Basophils Absolute 30 0 - 200 cells/uL   Neutrophils Relative % 62.8 %   Total Lymphocyte 25.2 %   Monocytes Relative 8.0 %   Eosinophils Relative 3.1 %   Basophils Relative 0.9 %   Smear Review      Comment: No platelet clumps seen. Review of peripheral smear confirms automated results.  COMPLETE METABOLIC PANEL WITH GFR     Status: Abnormal   Collection Time: 05/22/18 11:22 AM  Result Value Ref Range   Glucose, Bld 200 (H) 65 - 99 mg/dL    Comment: .            Fasting reference interval . For someone without known diabetes, a glucose value >125 mg/dL indicates that they may have diabetes and this should be confirmed with a follow-up test. .    BUN 13 7 - 25 mg/dL   Creat 1.610.65 (L) 0.960.70 - 1.33 mg/dL    Comment: For patients >56 years of age, the reference limit for Creatinine is approximately 13% higher for people identified as African-American. .    GFR, Est Non African American 110 > OR = 60 mL/min/1.1073m2   GFR, Est African American 127 > OR = 60 mL/min/1.3773m2   BUN/Creatinine Ratio 20 6 - 22 (calc)   Sodium 138 135 - 146 mmol/L   Potassium 4.0 3.5 - 5.3 mmol/L   Chloride 108 98 - 110 mmol/L   CO2 22 20 - 32 mmol/L   Calcium 8.6 8.6 - 10.3 mg/dL   Total Protein 6.5 6.1 - 8.1 g/dL   Albumin 3.8 3.6 - 5.1 g/dL   Globulin 2.7 1.9 - 3.7 g/dL (calc)   AG Ratio 1.4 1.0 - 2.5 (calc)   Total Bilirubin 0.9 0.2 - 1.2 mg/dL   Alkaline phosphatase (APISO) 91 40 - 115 U/L   AST 35 10 - 35 U/L   ALT 35 9 - 46 U/L  Lipid panel     Status: Abnormal   Collection Time: 05/22/18 11:22 AM  Result Value Ref Range   Cholesterol 119 <200 mg/dL   HDL 36 (L) >04>40 mg/dL   Triglycerides 65 <540<150 mg/dL   LDL Cholesterol (Calc) 69 mg/dL (calc)    Comment: Reference range: <100 . Desirable range <100 mg/dL for primary prevention;   <70 mg/dL for patients with CHD or diabetic patients  with > or = 2 CHD risk factors. Marland Kitchen. LDL-C is now calculated using the Martin-Hopkins   calculation, which is a validated novel method providing  better accuracy than the Friedewald equation in the  estimation of LDL-C.  Horald PollenMartin SS et al. Lenox AhrJAMA. 9811;914(782013;310(19): 2061-2068  (http://education.[employer].com/faq/FAQ164)    Total CHOL/HDL Ratio 3.3 <5.0 (calc)   Non-HDL Cholesterol (Calc) 83 <295<130 mg/dL (calc)    Comment: For patients with diabetes plus 1 major ASCVD risk  factor, treating to a non-HDL-C goal of <100 mg/dL  (LDL-C of <62<70 mg/dL) is considered a therapeutic  option.   PSA, Total with Reflex to PSA, Free     Status: None   Collection Time: 05/22/18 11:22 AM  Result Value Ref Range   PSA, Total 0.4 < OR = 4.0 ng/mL    Comment: The Total PSA value from this assay system is  standardized against the equimolar PSA standard.  The test result will be approximately 20% higher  when compared to the [facility]-standardized Total PSA  (Siemens assay). Comparison of serial PSA results  should be interpreted with this fact in mind. Marland Kitchen. PSA was performed using the Beckman Coulter  Immunoassay method. Values obtained from different  assay methods cannot be used interchangeably. PSA  levels, regardless of value, should not be  interpreted as absolute evidence of the presence or  absence of disease.    Orders Placed This Encounter  Procedures  . Urine Culture  . DG Lumbar Spine 2-3 Views  .  Tdap vaccine greater than or equal to 7yo IM  . Urinalysis, microscopic only  . CBC with Differential/Platelet  . COMPLETE METABOLIC PANEL WITH GFR  . Lipid panel  . PSA, Total with Reflex to PSA, Free  . Pathologist smear review  . Ambulatory referral to Physical Therapy  . POCT Urinalysis Dipstick

## 2018-05-22 NOTE — Patient Instructions (Addendum)
Plan: back   Xray today  See printed home exercises for back pain   Try muscle relaxer (cyclobenzaprine) prior to bed or as needed  Try anti-inflammatory (naproxen) twice a day for one week, then as-needed after that  Let me know if you'd like a steroid burst  Referral to physical therapy  If no better, would recommend follow-up with Dr T for this and other arthritic/orthopedic symptoms   Plan: skin  Looks like early infection  Antibiotics sent (bactrim)   Continue warm compresses few times per day   If it looks ready to burst, come see us or urgent care so we can obtain a culture of the discharge and ensure adequate treatment   Plan: diabetes  Continue current meds  Optimize diet/exercise  Will consider increasing Glipizide if A1C doesn't improve  Plan: blood pressure    Let's plan to follow-up in one week to recheck blood pressure at nurse visit   Routine labs also obtained today, return for annual physical in 3 months

## 2018-05-23 LAB — URINE CULTURE
MICRO NUMBER:: 90769649
Result:: NO GROWTH
SPECIMEN QUALITY: ADEQUATE

## 2018-05-23 LAB — URINALYSIS, MICROSCOPIC ONLY
BACTERIA UA: NONE SEEN /HPF
HYALINE CAST: NONE SEEN /LPF
RBC / HPF: NONE SEEN /HPF (ref 0–2)
Squamous Epithelial / LPF: NONE SEEN /HPF (ref <=5)
WBC, UA: NONE SEEN /HPF (ref 0–5)

## 2018-05-23 NOTE — Addendum Note (Signed)
Addended by: Deirdre PippinsALEXANDER, Chade Pitner M on: 05/23/2018 10:55 AM   Modules accepted: Orders

## 2018-05-26 LAB — CBC WITH DIFFERENTIAL/PLATELET
BASOS PCT: 0.9 %
Basophils Absolute: 30 cells/uL (ref 0–200)
EOS ABS: 102 {cells}/uL (ref 15–500)
Eosinophils Relative: 3.1 %
HEMATOCRIT: 40.5 % (ref 38.5–50.0)
Hemoglobin: 14 g/dL (ref 13.2–17.1)
Lymphs Abs: 832 cells/uL — ABNORMAL LOW (ref 850–3900)
MCH: 30.4 pg (ref 27.0–33.0)
MCHC: 34.6 g/dL (ref 32.0–36.0)
MCV: 88 fL (ref 80.0–100.0)
MPV: 11 fL (ref 7.5–12.5)
Monocytes Relative: 8 %
NEUTROS ABS: 2072 {cells}/uL (ref 1500–7800)
Neutrophils Relative %: 62.8 %
PLATELETS: 85 10*3/uL — AB (ref 140–400)
RBC: 4.6 10*6/uL (ref 4.20–5.80)
RDW: 14.2 % (ref 11.0–15.0)
Total Lymphocyte: 25.2 %
WBC: 3.3 10*3/uL — AB (ref 3.8–10.8)
WBCMIX: 264 {cells}/uL (ref 200–950)

## 2018-05-26 LAB — COMPLETE METABOLIC PANEL WITH GFR
AG Ratio: 1.4 (calc) (ref 1.0–2.5)
ALT: 35 U/L (ref 9–46)
AST: 35 U/L (ref 10–35)
Albumin: 3.8 g/dL (ref 3.6–5.1)
Alkaline phosphatase (APISO): 91 U/L (ref 40–115)
BUN/Creatinine Ratio: 20 (calc) (ref 6–22)
BUN: 13 mg/dL (ref 7–25)
CALCIUM: 8.6 mg/dL (ref 8.6–10.3)
CO2: 22 mmol/L (ref 20–32)
CREATININE: 0.65 mg/dL — AB (ref 0.70–1.33)
Chloride: 108 mmol/L (ref 98–110)
GFR, EST AFRICAN AMERICAN: 127 mL/min/{1.73_m2} (ref >=60)
GFR, EST NON AFRICAN AMERICAN: 110 mL/min/{1.73_m2} (ref >=60)
GLOBULIN: 2.7 g/dL (ref 1.9–3.7)
Glucose, Bld: 200 mg/dL — ABNORMAL HIGH (ref 65–99)
Potassium: 4 mmol/L (ref 3.5–5.3)
Sodium: 138 mmol/L (ref 135–146)
TOTAL PROTEIN: 6.5 g/dL (ref 6.1–8.1)
Total Bilirubin: 0.9 mg/dL (ref 0.2–1.2)

## 2018-05-26 LAB — TEST AUTHORIZATION

## 2018-05-26 LAB — LIPID PANEL
CHOL/HDL RATIO: 3.3 (calc) (ref <5.0)
Cholesterol: 119 mg/dL (ref <200)
HDL: 36 mg/dL — AB (ref >40)
LDL CHOLESTEROL (CALC): 69 mg/dL
NON-HDL CHOLESTEROL (CALC): 83 mg/dL (ref <130)
TRIGLYCERIDES: 65 mg/dL (ref <150)

## 2018-05-26 LAB — PSA, TOTAL WITH REFLEX TO PSA, FREE: PSA, TOTAL: 0.4 ng/mL (ref <=4.0)

## 2018-05-26 LAB — PATHOLOGIST SMEAR REVIEW

## 2018-06-05 ENCOUNTER — Encounter: Payer: Self-pay | Admitting: Osteopathic Medicine

## 2018-06-05 ENCOUNTER — Ambulatory Visit (INDEPENDENT_AMBULATORY_CARE_PROVIDER_SITE_OTHER): Payer: PRIVATE HEALTH INSURANCE | Admitting: Osteopathic Medicine

## 2018-06-05 VITALS — BP 130/60 | HR 84

## 2018-06-05 DIAGNOSIS — D696 Thrombocytopenia, unspecified: Secondary | ICD-10-CM

## 2018-06-05 DIAGNOSIS — I444 Left anterior fascicular block: Secondary | ICD-10-CM

## 2018-06-05 DIAGNOSIS — R002 Palpitations: Secondary | ICD-10-CM

## 2018-06-05 NOTE — Progress Notes (Signed)
HPI: Jeffrey Nichols is a 57 y.o. male who  has a past medical history of Diabetes mellitus (HCC) and Diabetes type 2, controlled (HCC) (10/19/2015).  [facility], 06/05/18,  for chief complaint of:  Heart Flutters BP recheck  Was here for nurse visit BP recheck and BP was looking better but he c/o daily "heart flutters" so we moved him to OV.   Reports a few times a day over the past couple of weeks has been having some episodes of feeling his heart racing/fluttering for up to 5 to 10 seconds at a time, resolve spontaneously, no obvious triggers.  Nothing waking him out of sleep.  Nothing that causes dizziness or shortness of breath, no chest pains.   Past medical history, surgical history, and family history reviewed.  Current medication list and allergy/intolerance information reviewed.   (See remainder of HPI, ROS, Phys Exam below)    EKG interpretation: Rate: 87 Rhythm: sinus L Ant Fasc block  No ST/T changes concerning for acute ischemia/infarct  Previous EKG no tracings available for comparison     ASSESSMENT/PLAN:   Palpitation - Symptoms sound consistent with PVC, may be more concerning for excisional SVT, other arrhythmia.  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  Unlikely significant, consider cardio referral based on echo/Holter  Thrombocytopenia (HCC) - Plan: CBC    Patient Instructions  Will get echocardiogram and Holter monitor to evaluate palpitations I think these are premature contractions, which are not dangerous Try cutting back caffeine    Follow-up plan: Return in about 1 month (around 07/03/2018) for LAB VISIT ONLY to recheck platelets and WBC, otherwise in 3 months recheck A1C  .     ############################################ ############################################ ############################################ ############################################    Outpatient Encounter Medications as of 06/05/2018  Medication Sig  . cyclobenzaprine (FLEXERIL) 10 MG tablet Take 0.5-1 tablets (5-10 mg total) by mouth 3 (three) times daily as needed for muscle spasms. Caution: can cause drowsiness  . enalapril (VASOTEC) 20 MG tablet Take 2 tablets (40 mg total) daily by mouth.  Marland Kitchen. glipiZIDE (GLUCOTROL) 5 MG tablet Take 1 tablet (5 mg total) by mouth 2 (two) times daily before a meal. Pt needs f/u appt w/PCP for refills.  . metFORMIN (GLUCOPHAGE) 1000 MG tablet TAKE 1 TABLET BY MOUTH TWICE DAILY WITH MEALS  . naproxen (NAPROSYN) 500 MG tablet Take 1 tablet (500 mg total) by mouth 2 (two) times daily with a meal. Take every day for one week, then use as needed after that  . sulfamethoxazole-trimethoprim (BACTRIM DS,SEPTRA DS) 800-160 MG tablet Take 1 tablet by mouth 2 (two) times daily.   No facility-administered encounter medications on file as of 06/05/2018.    Allergies  Allergen Reactions  . Meloxicam Other (See Comments)    headache      Review of Systems:  Constitutional: No recent illness  HEENT: No  headache, no vision change  Cardiac: No  chest pain, No  pressure, +palpitations  Hem/Onc: No  easy bruising/bleeding, No  abnormal lumps/bumps  Neurologic: No  weakness, No  Dizziness  Psychiatric: No  concerns with depression, No  concerns with anxiety  Exam:  BP 130/60   Pulse 84   Constitutional: VS see above. General Appearance: alert, well-developed, well-nourished, NAD  Eyes: Normal lids and conjunctive, non-icteric sclera  Ears, Nose, Mouth, Throat: MMM, Normal  external inspection ears/nares/mouth/lips/gums.  Neck: No masses, trachea midline.   Respiratory: Normal respiratory effort. no wheeze, no rhonchi, no rales  Cardiovascular:  S1/S2 normal, no murmur, no rub/gallop auscultated. RRR.   Musculoskeletal: Gait normal. Symmetric and independent movement of all extremities  Neurological: Normal balance/coordination. No tremor.  Skin: warm, dry, intact.   Psychiatric: Normal judgment/insight. Normal mood and affect. Oriented x3.   Visit summary with medication list and pertinent instructions was printed for patient to review, advised to alert [place] if any changes needed. All questions at time of visit were answered - patient instructed to contact office with any additional concerns. ER/RTC precautions were reviewed with the patient and understanding verbalized.   Follow-up plan: Return in about 1 month (around 07/03/2018) for LAB VISIT ONLY to recheck platelets and WBC, otherwise in 3 months recheck A1C .   Please note: voice recognition software was used to produce this document, and typos may escape review. Please contact Dr. Lyn Hollingshead for any needed clarifications.

## 2018-06-05 NOTE — Patient Instructions (Signed)
Will get echocardiogram and Holter monitor to evaluate palpitations I think these are premature contractions, which are not dangerous Try cutting back caffeine

## 2018-06-12 ENCOUNTER — Other Ambulatory Visit: Payer: Self-pay | Admitting: Osteopathic Medicine

## 2018-06-12 DIAGNOSIS — E119 Type 2 diabetes mellitus without complications: Secondary | ICD-10-CM

## 2018-06-19 ENCOUNTER — Encounter: Payer: Self-pay | Admitting: Osteopathic Medicine

## 2018-06-20 ENCOUNTER — Telehealth: Payer: Self-pay | Admitting: Family Medicine

## 2018-06-20 NOTE — Telephone Encounter (Signed)
Echocardiogram received from [facility]. Mild concentric left ventricular hypertrophy normal wall motion ejection fraction 60-65%. Mild 1+ tricuspid regurgitation.  Normal study otherwise.  We this original document and Dr. Lyn HollingsheadAlexander it is basket for further review.  Please inform patient.  Echocardiogram reassuring.

## 2018-06-23 NOTE — Telephone Encounter (Signed)
Pt has been informed of provider's noted. No further inquiries asked during phone call.

## 2018-07-03 ENCOUNTER — Other Ambulatory Visit: Payer: Self-pay | Admitting: Osteopathic Medicine

## 2018-07-03 DIAGNOSIS — I10 Essential (primary) hypertension: Secondary | ICD-10-CM

## 2018-09-04 ENCOUNTER — Ambulatory Visit: Payer: PRIVATE HEALTH INSURANCE | Admitting: Osteopathic Medicine

## 2018-09-11 ENCOUNTER — Other Ambulatory Visit: Payer: Self-pay | Admitting: Osteopathic Medicine

## 2018-09-11 DIAGNOSIS — E119 Type 2 diabetes mellitus without complications: Secondary | ICD-10-CM

## 2018-09-12 NOTE — Telephone Encounter (Signed)
Requested medication (s) are due for refill today: Yes  Requested medication (s) are on the active medication list: Yes  Last refill:  06/12/18  Future visit scheduled: No  Notes to clinic:  See request    Requested Prescriptions  Pending Prescriptions Disp Refills   glipiZIDE (GLUCOTROL) 5 MG tablet [Pharmacy Med Name: GlipiZIDE 5MG        TAB] 180 tablet 0    Sig: TAKE 1 TABLET BY MOUTH TWICE DAILY BEFORE A MEAL     Endocrinology:  Diabetes - Sulfonylureas Failed - 09/11/2018  7:50 PM      Failed - Valid encounter within last 6 months    Recent Outpatient Visits          3 months ago Palpitation   [facility] [place] Sunnie Nielsen, DO   3 months ago Strain of lumbar region, initial encounter   [facility] [place] Sunnie Nielsen, DO   7 months ago Cutaneous abscess of abdominal wall   [facility] [place] Sunnie Nielsen, DO   11 months ago Type 2 diabetes mellitus without complication, without long-term current use of insulin [facility])   [facility] [place] Sunnie Nielsen, DO   1 year ago Acute bilateral low back pain without sciatica   [facility] [place] Sunnie Nielsen, DO             Passed - HBA1C is between 0 and 7.9 and within 180 days    Hemoglobin A1C  Date Value Ref Range Status  04/08/2018 6.8  Final

## 2018-10-30 ENCOUNTER — Emergency Department (INDEPENDENT_AMBULATORY_CARE_PROVIDER_SITE_OTHER)
Admission: EM | Admit: 2018-10-30 | Discharge: 2018-10-30 | Disposition: A | Discharge status: Home / Self Care | Payer: PRIVATE HEALTH INSURANCE | Source: Home / Self Care | Attending: Family Medicine | Admitting: Family Medicine

## 2018-10-30 ENCOUNTER — Other Ambulatory Visit: Payer: Self-pay

## 2018-10-30 DIAGNOSIS — J069 Acute upper respiratory infection, unspecified: Secondary | ICD-10-CM

## 2018-10-30 DIAGNOSIS — J019 Acute sinusitis, unspecified: Secondary | ICD-10-CM

## 2018-10-30 MED ORDER — AMOXICILLIN-POT CLAVULANATE 875-125 MG PO TABS: 1.0000 | ORAL_TABLET | Freq: Two times a day (BID) | ORAL | 0 refills | Status: AC

## 2018-10-30 MED ORDER — IPRATROPIUM BROMIDE 0.06 % NA SOLN: 2.0000 | Freq: Four times a day (QID) | NASAL | 1 refills | Status: DC

## 2018-10-30 NOTE — ED Triage Notes (Signed)
Cough, facial congestion and pressure.  Runny nose, scratchy throat.  Feels like it is "moving into chest"

## 2018-10-30 NOTE — Discharge Instructions (Signed)

## 2018-10-30 NOTE — ED Provider Notes (Signed)
[facility]    CSN: [id number] Arrival date & time: 10/30/18  0847     History   Chief Complaint Chief Complaint  Patient presents with  . Cough  . facial pressure    HPI Jeffrey Nichols is a 57 y.o. male.   HPI  Jeffrey Nichols is a 57 y.o. male presenting to UC with c/o 1 month of nasal congestion and sinus pressure that has worsened since last night. Rhinorrhea and scratchy throat with ear pain. Pt states it feels like it is moving into his chest. He has tried Robitussin and ibuprofen with mild relief.  He felt feverish last night. His wife is also in UC to be seen for similar symptoms.    Past Medical History:  Diagnosis Date  . Diabetes mellitus (HCC)   . Diabetes type 2, controlled (HCC) 10/19/2015    Patient Active Problem List   Diagnosis Date Noted  . Abdominal aortic atherosclerosis (HCC) 05/22/2018  . Skin abscess 12/30/2017  . Chronic bilateral low back pain without sciatica 10/03/2017  . Essential hypertension 10/02/2017  . Colon polyps 04/17/2017  . Skin lesion of scalp 03/07/2016  . Skin lesion 03/07/2016  . Abnormal testicular exam 03/07/2016  . Type 2 diabetes mellitus without complication (HCC) 03/07/2016  . Thrombocytopenia (HCC) 03/07/2016  . Trigger finger, left 10/19/2015  . Trigger finger, right 10/19/2015    Past Surgical History:  Procedure Laterality Date  . ABCESS DRAINAGE     Nipple  . CHOLECYSTECTOMY    . ROTATOR CUFF REPAIR Right   . TONSILLECTOMY         Home Medications    Prior to Admission medications   Medication Sig Start Date End Date Taking? Authorizing Provider  amoxicillin-clavulanate (AUGMENTIN) 875-125 MG tablet Take 1 tablet by mouth 2 (two) times daily for 10 days. 10/30/18 11/09/18  Lurene Shadow, PA-C  cyclobenzaprine (FLEXERIL) 10 MG tablet Take 0.5-1 tablets (5-10 mg total) by mouth 3 (three) times daily as needed for muscle spasms. Caution: can cause drowsiness 05/22/18   Sunnie Nielsen, DO    enalapril (VASOTEC) 20 MG tablet TAKE 2 TABLETS BY MOUTH ONCE DAILY 07/04/18   Sunnie Nielsen, DO  glipiZIDE (GLUCOTROL) 5 MG tablet Take 1 tablet (5 mg total) by mouth 2 (two) times daily before a meal. No refills. Pt needs f/u appt w/provider. 09/12/18   Sunnie Nielsen, DO  ipratropium (ATROVENT) 0.06 % nasal spray Place 2 sprays into both nostrils 4 (four) times daily. 10/30/18   Lurene Shadow, PA-C  metFORMIN (GLUCOPHAGE) 1000 MG tablet TAKE 1 TABLET BY MOUTH TWICE DAILY WITH MEALS 05/15/18   Sunnie Nielsen, DO  naproxen (NAPROSYN) 500 MG tablet Take 1 tablet (500 mg total) by mouth 2 (two) times daily with a meal. Take every day for one week, then use as needed after that 05/22/18   Sunnie Nielsen, DO    Family History Family History  Problem Relation Age of Onset  . Bladder Cancer Father   . Skin cancer Father   . Diabetes Father   . Diabetes Mother     Social History Social History   Tobacco Use  . Smoking status: Current Every Day Smoker    Packs/day: 1.00    Years: 30.00    Pack years: 30.00    Types: Cigarettes  . Smokeless tobacco: Never Used  Substance Use Topics  . Alcohol use: Yes    Alcohol/week: 0.0 standard drinks  . Drug use: No  Allergies   Meloxicam   Review of Systems Review of Systems  Constitutional: Positive for fatigue and fever. Negative for chills.  HENT: Positive for congestion, ear pain, postnasal drip, rhinorrhea, sinus pressure, sinus pain and sore throat. Negative for trouble swallowing and voice change.   Respiratory: Positive for cough. Negative for shortness of breath.   Cardiovascular: Negative for chest pain and palpitations.  Gastrointestinal: Negative for abdominal pain, diarrhea, nausea and vomiting.  Musculoskeletal: Negative for arthralgias, back pain and myalgias.  Skin: Negative for rash.     Physical Exam Triage Vital Signs ED Triage Vitals  Enc Vitals Group     BP 10/30/18 0916 (!) 142/82     Pulse  Rate 10/30/18 0916 70     Resp 10/30/18 0916 18     Temp 10/30/18 0916 97.9 F (36.6 C)     Temp Source 10/30/18 0916 Oral     SpO2 10/30/18 0916 97 %     Weight 10/30/18 0917 227 lb (103 kg)     Height 10/30/18 0917 5\' 10"  (1.778 m)     Head Circumference --      Peak Flow --      Pain Score 10/30/18 0916 0     Pain Loc --      Pain Edu? --      Excl. in GC? --    No data found.  Updated Vital Signs BP (!) 142/82 (BP Location: Right Arm)   Pulse 70   Temp 97.9 F (36.6 C) (Oral)   Resp 18   Ht 5\' 10"  (1.778 m)   Wt 227 lb (103 kg)   SpO2 97%   BMI 32.57 kg/m   Visual Acuity Right Eye Distance:   Left Eye Distance:   Bilateral Distance:    Right Eye Near:   Left Eye Near:    Bilateral Near:     Physical Exam  Constitutional: He is oriented to person, place, and time. He appears well-developed and well-nourished. No distress.  HENT:  Head: Normocephalic and atraumatic.  Right Ear: Tympanic membrane normal.  Left Ear: Tympanic membrane normal.  Nose: Mucosal edema present. Right sinus exhibits maxillary sinus tenderness and frontal sinus tenderness. Left sinus exhibits maxillary sinus tenderness and frontal sinus tenderness.  Mouth/Throat: Uvula is midline, oropharynx is clear and moist and mucous membranes are normal.  Eyes: EOM are normal.  Neck: Normal range of motion. Neck supple.  Cardiovascular: Normal rate and regular rhythm.  Pulmonary/Chest: Effort normal and breath sounds normal. No stridor. No respiratory distress. He has no wheezes. He has no rales.  Musculoskeletal: Normal range of motion.  Neurological: He is alert and oriented to person, place, and time.  Skin: Skin is warm and dry. He is not diaphoretic.  Psychiatric: He has a normal mood and affect. His behavior is normal.  Nursing note and vitals reviewed.    UC Treatments / Results  Labs (all labs ordered are listed, but only abnormal results are displayed) Labs Reviewed - No data to  display  EKG None  Radiology No results found.  Procedures Procedures (including critical care time)  Medications Ordered in UC Medications - No data to display  Initial Impression / Assessment and Plan / UC Course  I have reviewed the triage vital signs and the nursing notes.  Pertinent labs & imaging results that were available during my care of the patient were reviewed by me and considered in my medical decision making (see chart for details).  Hx and exam c/w sinusitis and URI  Will start on antibiotics given duration of sinus symptoms  Final Clinical Impressions(s) / UC Diagnoses   Final diagnoses:  Upper respiratory tract infection, unspecified type  Acute rhinosinusitis     Discharge Instructions      Please take antibiotics as prescribed and be sure to complete entire course even if you start to feel better to ensure infection does not come back.  You may take 500mg  acetaminophen every 4-6 hours or in combination with ibuprofen 400-600mg  every 6-8 hours as needed for pain, inflammation, and fever.  Be sure to well hydrated with clear liquids and get at least 8 hours of sleep at night, preferably more while sick.   Please follow up with family medicine in 1 week if needed.     ED Prescriptions    Medication Sig Dispense Auth. Provider   amoxicillin-clavulanate (AUGMENTIN) 875-125 MG tablet Take 1 tablet by mouth 2 (two) times daily for 10 days. 20 tablet Doroteo GlassmanPhelps, Tawney Vanorman O, PA-C   ipratropium (ATROVENT) 0.06 % nasal spray Place 2 sprays into both nostrils 4 (four) times daily. 15 mL Lurene ShadowPhelps, Katy Brickell O, PA-C     Controlled Substance Prescriptions [place] Controlled Substance Registry consulted? Not Applicable   Rolla Platehelps, Bonny Vanleeuwen O, PA-C 10/30/18 1056

## 2018-11-17 ENCOUNTER — Telehealth: Payer: Self-pay | Admitting: Osteopathic Medicine

## 2018-11-17 DIAGNOSIS — R7989 Other specified abnormal findings of blood chemistry: Secondary | ICD-10-CM

## 2018-11-17 DIAGNOSIS — I10 Essential (primary) hypertension: Secondary | ICD-10-CM

## 2018-11-17 MED ORDER — HYDROCHLOROTHIAZIDE 12.5 MG PO TABS: 12.5000 mg | ORAL_TABLET | Freq: Every day | ORAL | 1 refills | Status: DC

## 2018-11-17 MED ORDER — ENALAPRIL MALEATE 20 MG PO TABS: 40.0000 mg | ORAL_TABLET | Freq: Every day | ORAL | 1 refills | Status: DC

## 2018-11-17 NOTE — Telephone Encounter (Signed)
Routing to PCP for review.

## 2018-11-17 NOTE — Telephone Encounter (Signed)
Pt advised. He is taking 2 tabs of Enalapril. Will bring home BP cuff and readings to appt.

## 2018-11-17 NOTE — Telephone Encounter (Signed)
Pt advised.

## 2018-11-17 NOTE — Telephone Encounter (Signed)
O Ki sent in hydrochlorothiazide to take in addition to the enalapril

## 2018-11-17 NOTE — Telephone Encounter (Signed)
I can put in orders for lab work ahead of visit.  Does not need to be done fasting.  Please make sure that he is taking 2 tablets of the enalapril as directed, please ask him to bring his home blood pressure cuff to the office for verification.  If he is taking the enalapril as directed, I will call in an additional medicine to add to his current regimen.

## 2018-11-17 NOTE — Telephone Encounter (Signed)
Patient wants to know if he needs to come in early to get the labs for his WBC before the appointment on 12/26. Also states that he has had elevated blood pressure for the last month. States it has not gone under 170. Please contact and advise.

## 2018-11-20 ENCOUNTER — Ambulatory Visit (INDEPENDENT_AMBULATORY_CARE_PROVIDER_SITE_OTHER): Payer: PRIVATE HEALTH INSURANCE | Admitting: Osteopathic Medicine

## 2018-11-20 ENCOUNTER — Encounter: Payer: Self-pay | Admitting: Osteopathic Medicine

## 2018-11-20 VITALS — BP 147/72 | HR 69 | Temp 98.0°F | Wt 226.8 lb

## 2018-11-20 DIAGNOSIS — R7989 Other specified abnormal findings of blood chemistry: Secondary | ICD-10-CM | POA: Diagnosis not present

## 2018-11-20 DIAGNOSIS — I10 Essential (primary) hypertension: Secondary | ICD-10-CM

## 2018-11-20 DIAGNOSIS — E119 Type 2 diabetes mellitus without complications: Secondary | ICD-10-CM

## 2018-11-20 DIAGNOSIS — F17200 Nicotine dependence, unspecified, uncomplicated: Secondary | ICD-10-CM | POA: Diagnosis not present

## 2018-11-20 DIAGNOSIS — Z716 Tobacco abuse counseling: Secondary | ICD-10-CM

## 2018-11-20 LAB — POCT GLYCOSYLATED HEMOGLOBIN (HGB A1C): Hemoglobin A1C: 6.5 % — AB (ref 4.0–5.6)

## 2018-11-20 MED ORDER — TRAZODONE HCL 50 MG PO TABS: 50.0000 mg | ORAL_TABLET | Freq: Every evening | ORAL | 0 refills | Status: DC | PRN

## 2018-11-20 MED ORDER — ATORVASTATIN CALCIUM 40 MG PO TABS: 40.0000 mg | ORAL_TABLET | Freq: Every day | ORAL | 3 refills | Status: DC

## 2018-11-20 MED ORDER — HYDROCHLOROTHIAZIDE 12.5 MG PO TABS: 12.5000 mg | ORAL_TABLET | Freq: Two times a day (BID) | ORAL | 1 refills | Status: DC

## 2018-11-20 NOTE — Progress Notes (Signed)
HPI: Jeffrey Nichols is a 57 y.o. male who  has a past medical history of Diabetes mellitus (HCC) and Diabetes type 2, controlled (HCC) (10/19/2015).  he presents to [facility] today, 11/20/18,  for chief complaint of:  BP follow-up DM2 check Labs - f/u CBC   BP: Home monitor today 181/96, our reading 150/78 Was reporting high BP at home  Taking Enalapril 40 mg daily  HCTZ recently added 11/17/18  DM2: Overdue for follow-up  Taking Glipizide 5 mg bid, Metformin 1000 mg bid Last A1C 6.8% 03/2018 A1C today: 6.5%  Low WBC: Due for recheck labs Blood draw this AM just prior to appt   Current smoker, trying to quit   Noting more stress and difficulty sleeping recently. Worse with holidays. Would like to avoid sedatives like Ambien but asks if anything else for sleep.        At today's visit... Past medical history, surgical history, and family history reviewed and updated as needed.  Current medication list and allergy/intolerance information reviewed and updated as needed. (See remainder of HPI, ROS, Phys Exam below)   Results for orders placed or performed in visit on 11/20/18 (from the past 24 hour(s))  POCT HgB A1C     Status: Abnormal   Collection Time: 11/20/18  8:08 AM  Result Value Ref Range   Hemoglobin A1C 6.5 (A) 4.0 - 5.6 %   HbA1c POC (<> result, manual entry)     HbA1c, POC (prediabetic range)     HbA1c, POC (controlled diabetic range)            ASSESSMENT/PLAN: The primary encounter diagnosis was Type 2 diabetes mellitus without complication, without long-term current use of insulin (HCC). Diagnoses of Essential hypertension, Abnormal CBC, Tobacco dependence, and Tobacco abuse counseling were also pertinent to this visit.  Wheezing on exam - pt declined steroids, strongly consider PFT for COPD w/u in smoker   Orders Placed This Encounter  Procedures  . POCT HgB A1C     Meds ordered this encounter   Medications  . atorvastatin (LIPITOR) 40 MG tablet    Sig: Take 1 tablet (40 mg total) by mouth daily.    Dispense:  90 tablet    Refill:  3  . traZODone (DESYREL) 50 MG tablet    Sig: Take 1-3 tablets (50-150 mg total) by mouth at bedtime as needed for sleep.    Dispense:  90 tablet    Refill:  0  . hydrochlorothiazide (HYDRODIURIL) 12.5 MG tablet    Sig: Take 1 tablet (12.5 mg total) by mouth 2 (two) times daily.    Dispense:  30 tablet    Refill:  1 Increased from once daily     Patient Instructions  Will start cholesterol medication to prevent future heart attack and stroke  Will start Trazodone to help with sleep  Will call once I have blood work results (probably Monday or Tuesday)        Follow-up plan: Return in about 4 months (around 03/22/2019) for ANNUAL PHYSICAL W/ FASTING LABS - sooner if needed .                             ############################################ ############################################ ############################################ ############################################    Current Meds  Medication Sig  . enalapril (VASOTEC) 20 MG tablet Take 2 tablets (40 mg total) by mouth daily.  Marland Kitchen. glipiZIDE (GLUCOTROL) 5 MG tablet Take 1 tablet (5 mg  total) by mouth 2 (two) times daily before a meal. No refills. Pt needs f/u appt w/provider.  . hydrochlorothiazide (HYDRODIURIL) 12.5 MG tablet Take 1 tablet (12.5 mg total) by mouth daily.  . metFORMIN (GLUCOPHAGE) 1000 MG tablet TAKE 1 TABLET BY MOUTH TWICE DAILY WITH MEALS  . naproxen (NAPROSYN) 500 MG tablet Take 1 tablet (500 mg total) by mouth 2 (two) times daily with a meal. Take every day for one week, then use as needed after that    Allergies  Allergen Reactions  . Meloxicam Other (See Comments)    headache       Review of Systems:  Constitutional: +recent URI-type illness  HEENT: No  headache, no vision change  Cardiac: No  chest pain, No   pressure, No palpitations  Respiratory:  No  shortness of breath. +Cough  Gastrointestinal: No  abdominal pain, no change on bowel habits  Musculoskeletal: No new myalgia/arthralgia  Skin: No  Rash  Hem/Onc: No  easy bruising/bleeding, No  abnormal lumps/bumps  Neurologic: No  weakness, No  Dizziness  Psychiatric: No  concerns with depression, +concerns with anxiety  Exam:  BP (!) 147/72 (BP Location: Right Arm, Patient Position: Sitting, Cuff Size: Normal)   Pulse 69   Temp 98 F (36.7 C) (Oral)   Wt 226 lb 12.8 oz (102.9 kg)   BMI 32.54 kg/m   Constitutional: VS see above. General Appearance: alert, well-developed, well-nourished, NAD  Eyes: Normal lids and conjunctive, non-icteric sclera  Ears, Nose, Mouth, Throat: MMM, Normal external inspection ears/nares/mouth/lips/gums.  Neck: No masses, trachea midline.   Respiratory: Normal respiratory effort. +faint wheeze, no rhonchi, no rales  Cardiovascular: S1/S2 normal, no murmur, no rub/gallop auscultated. RRR.   Musculoskeletal: Gait normal. Symmetric and independent movement of all extremities  Neurological: Normal balance/coordination. No tremor.  Skin: warm, dry, intact.   Psychiatric: Normal judgment/insight. Normal mood and affect. Oriented x3.       Visit summary with medication list and pertinent instructions was printed for patient to review, patient was advised to alert us if any updates are needed. All questions at time of visit were answered - patient instructed to contact office with any additional concerns. ER/RTC precautions were reviewed with the patient and understanding verbalized.      Please note: voice recognition software was used to produce this document, and typos may escape review. Please contact Dr. Lyn HollingsheadAlexander for any needed clarifications.    Follow up plan: Return in about 4 months (around 03/22/2019) for ANNUAL PHYSICAL W/ FASTING LABS - sooner if needed .

## 2018-11-20 NOTE — Patient Instructions (Addendum)
Will start cholesterol medication to prevent future heart attack and stroke  Will start Trazodone to help with sleep  Will call once I have blood work results (probably Monday or Tuesday)

## 2018-11-21 LAB — CBC (INCLUDES DIFF/PLT) WITH PATHOLOGIST REVIEW
Absolute Monocytes: 330 cells/uL (ref 200–950)
BASOS ABS: 42 {cells}/uL (ref 0–200)
Basophils Relative: 1.3 %
EOS ABS: 179 {cells}/uL (ref 15–500)
Eosinophils Relative: 5.6 %
HEMATOCRIT: 42.3 % (ref 38.5–50.0)
HEMOGLOBIN: 14.2 g/dL (ref 13.2–17.1)
Lymphs Abs: 790 cells/uL — ABNORMAL LOW (ref 850–3900)
MCH: 29.8 pg (ref 27.0–33.0)
MCHC: 33.6 g/dL (ref 32.0–36.0)
MCV: 88.7 fL (ref 80.0–100.0)
MPV: 11.4 fL (ref 7.5–12.5)
Monocytes Relative: 10.3 %
NEUTROS ABS: 1859 {cells}/uL (ref 1500–7800)
Neutrophils Relative %: 58.1 %
Platelets: 100 10*3/uL — ABNORMAL LOW (ref 140–400)
RBC: 4.77 10*6/uL (ref 4.20–5.80)
RDW: 14.4 % (ref 11.0–15.0)
Total Lymphocyte: 24.7 %
WBC: 3.2 10*3/uL — ABNORMAL LOW (ref 3.8–10.8)

## 2018-11-21 NOTE — Addendum Note (Signed)
Addended by: Deirdre PippinsALEXANDER, Aquilla Voiles M on: 11/21/2018 12:51 PM   Modules accepted: Orders

## 2018-12-04 ENCOUNTER — Ambulatory Visit (INDEPENDENT_AMBULATORY_CARE_PROVIDER_SITE_OTHER): Payer: PRIVATE HEALTH INSURANCE | Admitting: Osteopathic Medicine

## 2018-12-04 VITALS — BP 132/62 | HR 74

## 2018-12-04 DIAGNOSIS — I10 Essential (primary) hypertension: Secondary | ICD-10-CM

## 2018-12-04 NOTE — Progress Notes (Signed)
Looks good, continue current meds, alert pharmacy if/when refills needed

## 2018-12-04 NOTE — Progress Notes (Signed)
Jeffrey Nichols presents to the clinic for a BP check. BP was 132/62.  Pt advised that he will get a call if Dr. Lyn Hollingshead had any other recommendations. -EH/RMA

## 2018-12-15 ENCOUNTER — Telehealth: Payer: Self-pay | Admitting: Hematology & Oncology

## 2018-12-15 ENCOUNTER — Telehealth: Payer: Self-pay

## 2018-12-15 NOTE — Telephone Encounter (Signed)
Patient came into office today for lab/new patient appt. Patient was informed that his appt was in [facility] 1/22. He stated that someone form the referring office told him the appt was 1/20 at 11 am. I told the patient we could sch him for this afternoon at 2 pm and patient declined all appts. Cxled appts per pt request

## 2018-12-15 NOTE — Telephone Encounter (Signed)
I sent referral to [facility]

## 2018-12-15 NOTE — Telephone Encounter (Signed)
Jeffrey Nichols would [place]. Please change.

## 2018-12-16 ENCOUNTER — Other Ambulatory Visit: Payer: Self-pay | Admitting: Osteopathic Medicine

## 2018-12-16 DIAGNOSIS — E119 Type 2 diabetes mellitus without complications: Secondary | ICD-10-CM

## 2018-12-17 ENCOUNTER — Ambulatory Visit: Payer: PRIVATE HEALTH INSURANCE | Admitting: Hematology & Oncology

## 2018-12-17 ENCOUNTER — Other Ambulatory Visit: Payer: PRIVATE HEALTH INSURANCE

## 2019-01-27 ENCOUNTER — Other Ambulatory Visit: Payer: Self-pay | Admitting: Osteopathic Medicine

## 2019-02-01 ENCOUNTER — Other Ambulatory Visit: Payer: Self-pay | Admitting: Osteopathic Medicine

## 2019-02-01 DIAGNOSIS — E119 Type 2 diabetes mellitus without complications: Secondary | ICD-10-CM

## 2019-02-02 LAB — HM DIABETES EYE EXAM

## 2019-02-18 ENCOUNTER — Telehealth: Payer: Self-pay

## 2019-02-18 NOTE — Telephone Encounter (Signed)
Jeffrey Nichols called and asked if he should be tested to COVID-19. I advised patient of CDC and [facility] recommendations for testing. I advised patient patient of CDC recommendations for those at high risk of severe illness due to COVID-19.   If you have a serious underlying medical condition: Stay home if possible.  Wash your hands often.  Avoid close contact (6 feet, which is about two arm lengths) with people who are sick.  Clean and disinfect frequently touched surfaces.  Avoid all cruise travel and non-essential air travel.

## 2019-02-18 NOTE — Telephone Encounter (Signed)
Noted and agree. 

## 2019-03-11 ENCOUNTER — Encounter: Payer: Self-pay | Admitting: Osteopathic Medicine

## 2019-04-30 ENCOUNTER — Other Ambulatory Visit: Payer: Self-pay | Admitting: Osteopathic Medicine

## 2019-04-30 DIAGNOSIS — E119 Type 2 diabetes mellitus without complications: Secondary | ICD-10-CM

## 2019-07-03 ENCOUNTER — Other Ambulatory Visit: Payer: Self-pay | Admitting: Osteopathic Medicine

## 2019-07-03 DIAGNOSIS — E119 Type 2 diabetes mellitus without complications: Secondary | ICD-10-CM

## 2019-07-03 NOTE — Telephone Encounter (Signed)
Forwarding medication refill request to PCP for review. 

## 2019-07-06 ENCOUNTER — Ambulatory Visit (INDEPENDENT_AMBULATORY_CARE_PROVIDER_SITE_OTHER): Payer: PRIVATE HEALTH INSURANCE | Admitting: Osteopathic Medicine

## 2019-07-06 ENCOUNTER — Encounter: Payer: Self-pay | Admitting: Osteopathic Medicine

## 2019-07-06 ENCOUNTER — Other Ambulatory Visit: Payer: Self-pay

## 2019-07-06 ENCOUNTER — Ambulatory Visit (INDEPENDENT_AMBULATORY_CARE_PROVIDER_SITE_OTHER): Payer: PRIVATE HEALTH INSURANCE

## 2019-07-06 VITALS — BP 158/66 | HR 77 | Temp 98.3°F | Wt 221.3 lb

## 2019-07-06 DIAGNOSIS — L0291 Cutaneous abscess, unspecified: Secondary | ICD-10-CM | POA: Diagnosis not present

## 2019-07-06 DIAGNOSIS — M25551 Pain in right hip: Secondary | ICD-10-CM

## 2019-07-06 MED ORDER — CEPHALEXIN 500 MG PO CAPS: 500.0000 mg | ORAL_CAPSULE | Freq: Two times a day (BID) | ORAL | 0 refills | Status: DC

## 2019-07-06 MED ORDER — PREDNISONE 10 MG (48) PO TBPK: ORAL_TABLET | Freq: Every day | ORAL | 0 refills | Status: DC

## 2019-07-06 NOTE — Progress Notes (Signed)
HPI: Jeffrey Nichols is a 58 y.o. male who  has a past medical history of Diabetes mellitus (HCC) and Diabetes type 2, controlled (HCC) (10/19/2015).  [facility] today, 07/06/19,  for chief complaint of:  Hip pain  Skin problem   Hip problem . Context/duration: No injury that he can recall, pain has been ongoing for several months. . Location: Right hip and lower back . Quality: Significant soreness, feels occasionally like it is catching . Severity: Worse over the past few weeks . Modifying factors: Has been alternating between ibuprofen and naproxen. . Assoc signs/symptoms: No loss of bowel or bladder control, no numbness or tingling down the legs, no weakness or falls.  Skin problem Concerned about skin issue, seems to have chronic abscess/lump on the left groin and had draining cyst/abscess on back of right thigh.       At today's visit 07/08/19 ... PMH, PSH, FH reviewed and updated as needed.  Current medication list and allergy/intolerance hx reviewed and updated as needed. (See remainder of HPI, ROS, Phys Exam below)   Dg Si Joints  Result Date: 07/06/2019 CLINICAL DATA:  Right SI joint pain, no known injury EXAM: BILATERAL SACROILIAC JOINTS - 3+ VIEW COMPARISON:  Same-day pelvic radiographs FINDINGS: No abnormal diastasis widening or fusion across the SI joints. Mild arthrosis is present with sclerotic features predominately on the iliac sides of the joints. Mild bilateral hip arthrosis is again demonstrated. Enthesopathic changes of the pelvis are present. Discogenic and facet degenerative changes are present in the lower lumbar spine. Vascular calcium is present in the pelvis. 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 Discogenic and facet degenerative changes in the lower lumbar spine. Electronically Signed   By: Kreg ShropshirePrice  DeHay M.D.   On: 07/06/2019 20:14   Dg Hip Unilat  W Or W/o Pelvis 2-3 Views Right  Result Date: 07/06/2019 CLINICAL DATA:  Right hip and SI joint pain. No known injury. EXAM: DG HIP (WITH OR WITHOUT PELVIS) 2-3V RIGHT COMPARISON:  None. FINDINGS: No acute fracture or traumatic malalignment of the hips or pelvis. Bilateral hip arthrosis is present including a corticated os acetabulum about the superior margin of the left hip. There are degenerative sclerotic changes at the SI joints as well. Minimal degenerative changes present lower lumbar spine. The sub pathic changes are noted at the iliac crests and ischial tuberosities. The soft tissues are unremarkable. IMPRESSION: Bilateral hip arthrosis. No acute osseous abnormality. Electronically Signed   By: Kreg ShropshirePrice  DeHay M.D.   On: 07/06/2019 20:12    No results found for this or any previous visit (from the past 72 hour(s)).        ASSESSMENT/PLAN: The primary encounter diagnosis was Hip pain, right. A diagnosis of Abscess was also pertinent to this visit.   Already drained and healing/indurated abscess, patient thinks it is getting worse over the past few days we will go ahead and send antibiotics   Orders Placed This Encounter  Procedures  . DG HIP UNILAT W OR W/O PELVIS 2-3 VIEWS RIGHT  . DG Si Joints  . Ambulatory referral to Physical Therapy     Meds ordered this encounter  Medications  . predniSONE (STERAPRED UNI-PAK 48 TAB) 10 MG (48) TBPK tablet    Sig: Take by mouth daily. 12-Day taper, po    Dispense:  48 tablet    Refill:  0  . cephALEXin (KEFLEX) 500 MG capsule  Sig: Take 1 capsule (500 mg total) by mouth 2 (two) times daily.    Dispense:  14 capsule    Refill:  0    Patient Instructions  If hip is not better or if it gets worse, I would recommend follow-up with one of our sports medicine specialists (Dr Denyse Amassorey or Dr. Cherylann Parrhekkekandam aka Dr. Karie Schwalbe) for further evaluation in 2-4 weeks. Just call our office and ask for an appointment for sports medicine!   In the meantime, ok  to continue ibuprofen OR naproxen, can take these with Tylenol if needed. I sent Rx for steroid burst as well to get things to calm down. I suspect arthritis. Will see what Xray shows!      Hip Exercises Ask your health care provider which exercises are safe for you. Do exercises exactly as told by your health care provider and adjust them as directed. It is normal to feel mild stretching, pulling, tightness, or discomfort as you do these exercises. Stop right away if you feel sudden pain or your pain gets worse. Do not begin these exercises until told by your health care provider. Stretching and range-of-motion exercises These exercises warm up your muscles and joints and improve the movement and flexibility of your hip. These exercises also help to relieve pain, numbness, and tingling. You may be asked to limit your range of motion if you had a hip replacement. Talk to your health care provider about these restrictions. Hamstrings, supine  1. Lie on your back (supine position). 2. Loop a belt or towel over the ball of your left / right foot. The ball of your foot is on the walking surface, right under your toes. 3. Straighten your left / right knee and slowly pull on the belt or towel to raise your leg until you feel a gentle stretch behind your knee (hamstring). ? Do not let your knee bend while you do this. ? Keep your other leg flat on the floor. 4. Hold this position for __________ seconds. 5. Slowly return your leg to the starting position. Repeat __________ times. Complete this exercise __________ times a day. Hip rotation  1. Lie on your back on a firm surface. 2. With your left / right hand, gently pull your left / right knee toward the shoulder that is on the same side of the body. Stop when your knee is pointing toward the ceiling. 3. Hold your left / right ankle with your other hand. 4. Keeping your knee steady, gently pull your left / right ankle toward your other shoulder  until you feel a stretch in your buttocks. ? Keep your hips and shoulders firmly planted while you do this stretch. 5. Hold this position for __________ seconds. Repeat __________ times. Complete this exercise __________ times a day. Seated stretch This exercise is sometimes called hamstrings and adductors stretch. 1. Sit on the floor with your legs stretched wide. Keep your knees straight during this exercise. 2. Keeping your head and back in a straight line, bend at your waist to reach for your left foot (position A). You should feel a stretch in your right inner thigh (adductors). 3. Hold this position for __________ seconds. Then slowly return to the upright position. 4. Keeping your head and back in a straight line, bend at your waist to reach forward (position B). You should feel a stretch behind both of your thighs and knees (hamstrings). 5. Hold this position for __________ seconds. Then slowly return to the upright position. 6. Keeping  your head and back in a straight line, bend at your waist to reach for your right foot (position C). You should feel a stretch in your left inner thigh (adductors). 7. Hold this position for __________ seconds. Then slowly return to the upright position. Repeat __________ times. Complete this exercise __________ times a day. Lunge This exercise stretches the muscles of the hip (hip flexors). 1. Place your left / right knee on the floor and bend your other knee so that is directly over your ankle. You should be half-kneeling. 2. Keep good posture with your head over your shoulders. 3. Tighten your buttocks to point your tailbone downward. This will prevent your back from arching too much. 4. You should feel a gentle stretch in the front of your left / right thigh and hip. If you do not feel a stretch, slide your other foot forward slightly and then slowly lunge forward with your chest up until your knee once again lines up over your ankle. ? Make sure your  tailbone continues to point downward. 5. Hold this position for __________ seconds. 6. Slowly return to the starting position. Repeat __________ times. Complete this exercise __________ times a day. Strengthening exercises These exercises build strength and endurance in your hip. Endurance is the ability to use your muscles for a long time, even after they get tired. Bridge This exercise strengthens the muscles of your hip (hip extensors). 1. Lie on your back on a firm surface with your knees bent and your feet flat on the floor. 2. Tighten your buttocks muscles and lift your bottom off the floor until the trunk of your body and your hips are level with your thighs. ? Do not arch your back. ? You should feel the muscles working in your buttocks and the back of your thighs. If you do not feel these muscles, slide your feet 1-2 inches (2.5-5 cm) farther away from your buttocks. 3. Hold this position for __________ seconds. 4. Slowly lower your hips to the starting position. 5. Let your muscles relax completely between repetitions. Repeat __________ times. Complete this exercise __________ times a day. Straight leg raises, side-lying This exercise strengthens the muscles that move the hip joint away from the center of the body (hip abductors). 1. Lie on your side with your left / right leg in the top position. Lie so your head, shoulder, hip, and knee line up. You may bend your bottom knee slightly to help you balance. 2. Roll your hips slightly forward, so your hips are stacked directly over each other and your left / right knee is facing forward. 3. Leading with your heel, lift your top leg 4-6 inches (10-15 cm). You should feel the muscles in your top hip lifting. ? Do not let your foot drift forward. ? Do not let your knee roll toward the ceiling. 4. Hold this position for __________ seconds. 5. Slowly return to the starting position. 6. Let your muscles relax completely between  repetitions. Repeat __________ times. Complete this exercise __________ times a day. Straight leg raises, side-lying This exercise strengthen the muscles that move the hip joint toward the center of the body (hip adductors). 1. Lie on your side with your left / right leg in the bottom position. Lie so your head, shoulder, hip, and knee line up. You may place your upper foot in front to help you balance. 2. Roll your hips slightly forward, so your hips are stacked directly over each other and your left / right  knee is facing forward. 3. Tense the muscles in your inner thigh and lift your bottom leg 4-6 inches (10-15 cm). 4. Hold this position for __________ seconds. 5. Slowly return to the starting position. 6. Let your muscles relax completely between repetitions. Repeat __________ times. Complete this exercise __________ times a day. Straight leg raises, supine This exercise strengthens the muscles in the front of your thigh (quadriceps). 1. Lie on your back (supine position) with your left / right leg extended and your other knee bent. 2. Tense the muscles in the front of your left / right thigh. You should see your kneecap slide up or see increased dimpling just above your knee. 3. Keep these muscles tight as you raise your leg 4-6 inches (10-15 cm) off the floor. Do not let your knee bend. 4. Hold this position for __________ seconds. 5. Keep these muscles tense as you lower your leg. 6. Relax the muscles slowly and completely between repetitions. Repeat __________ times. Complete this exercise __________ times a day. Hip abductors, standing This exercise strengthens the muscles that move the leg and hip joint away from the center of the body (hip abductors). 1. Tie one end of a rubber exercise band or tubing to a secure surface, such as a chair, table, or pole. 2. Loop the other end of the band or tubing around your left / right ankle. 3. Keeping your ankle with the band or tubing  directly opposite the secured end, step away until there is tension in the tubing or band. Hold on to a chair, table, or pole as needed for balance. 4. Lift your left / right leg out to your side. While you do this: ? Keep your back upright. ? Keep your shoulders over your hips. ? Keep your toes pointing forward. ? Make sure to use your hip muscles to slowly lift your leg. Do not tip your body or forcefully lift your leg. 5. Hold this position for __________ seconds. 6. Slowly return to the starting position. Repeat __________ times. Complete this exercise __________ times a day. Squats This exercise strengthens the muscles in the front of your thigh (quadriceps). 1. Stand in a door frame so your feet and knees are in line with the frame. You may place your hands on the frame for balance. 2. Slowly bend your knees and lower your hips like you are going to sit in a chair. ? Keep your lower legs in a straight-up-and-down position. ? Do not let your hips go lower than your knees. ? Do not bend your knees lower than told by your health care provider. ? If your hip pain increases, do not bend as low. 3. Hold this position for ___________ seconds. 4. Slowly push with your legs to return to standing. Do not use your hands to pull yourself to standing. Repeat __________ times. Complete this exercise __________ times a day. This information is not intended to replace advice given to you by your health care provider. Make sure you discuss any questions you have with your health care provider. Document Released: 11/30/2005 Document Revised: 09/23/2018 Document Reviewed: 09/23/2018 Elsevier Patient Education  [place].      Follow-up plan: Return for [place] IF PERSISTS .                                                 #################################################  ################################################# ################################################# #################################################  Current Meds  Medication Sig  . atorvastatin (LIPITOR) 40 MG tablet Take 1 tablet (40 mg total) by mouth daily.  . enalapril (VASOTEC) 20 MG tablet Take 2 tablets (40 mg total) by mouth daily.  Marland Kitchen. glipiZIDE (GLUCOTROL) 5 MG tablet TAKE 1 TABLET BY MOUTH TWICE DAILY BEFORE MEALS. NO REFILLS NEED FOLLOW UP APPOINTMENT WITH PROVIDER.  . hydrochlorothiazide (HYDRODIURIL) 12.5 MG tablet Take 1 tablet by mouth once daily  . metFORMIN (GLUCOPHAGE) 1000 MG tablet TAKE 1 TABLET BY MOUTH TWICE DAILY WITH MEALS  . traZODone (DESYREL) 50 MG tablet Take 1-3 tablets (50-150 mg total) by mouth at bedtime as needed for sleep.    Allergies  Allergen Reactions  . Meloxicam Other (See Comments)    headache        Review of Systems:  Constitutional: No recent illness  HEENT: No  headache, no vision change  Gastrointestinal: No  abdominal pain  Musculoskeletal: +myalgia/arthralgia per HPI  Skin: +Rash  Neurologic: No  weakness, No  Dizziness  Psychiatric: No  concerns with depression, No  concerns with anxiety  Exam:  BP (!) 158/66 (BP Location: Left Arm, Patient Position: Sitting, Cuff Size: Normal)   Pulse 77   Temp 98.3 F (36.8 C) (Oral)   Wt 221 lb 4.8 oz (100.4 kg)   BMI 31.75 kg/m   Constitutional: VS see above. General Appearance: alert, well-developed, well-nourished, NAD  Eyes: Normal lids and conjunctive, non-icteric sclera  Ears, Nose, Mouth, Throat: MMM, Normal external inspection ears/nares/mouth/lips/gums.  Neck: No masses, trachea midline.   Respiratory: Normal respiratory effort.   Musculoskeletal: Gait normal. Symmetric and independent movement of all extremities.  Pain with hip internal rotation and straight leg raise produces pain in right SI area.  Negative logroll, negative Pearlean BrownieFaber.  Abdominal: non-tender,  non-distended  Neurological: Normal balance/coordination. No tremor.  Skin: Abscesses on left groin and back of right thigh, indurated, nonfluctuant, no significant erythema, no active drainage.  Psychiatric: Normal judgment/insight. Normal mood and affect. Oriented x3.       Visit summary with medication list and pertinent instructions was printed for patient to review, patient was advised to alert us if any updates are needed. All questions at time of visit were answered - patient instructed to contact office with any additional concerns. ER/RTC precautions were reviewed with the patient and understanding verbalized.   Note: Total time spent 25 minutes, greater than 50% of the visit was spent face-to-face counseling and coordinating care for the following: The primary encounter diagnosis was Hip pain, right. A diagnosis of Abscess was also pertinent to this visit.Marland Kitchen.  Please note: voice recognition software was used to produce this document, and typos may escape review. Please contact Dr. Lyn HollingsheadAlexander for any needed clarifications.    Follow up plan: Return for VISIT WITH SPORTS MEDICINE FOR ORTHOPEDIC ISSUE IF PERSISTS .

## 2019-07-06 NOTE — Patient Instructions (Addendum)
If hip is not better or if it gets worse, I would recommend follow-up with one of our sports medicine specialists (Dr Georgina Snell or Dr. Patton Salles Dr. Darene Lamer) for further evaluation in 2-4 weeks. Just call our office and ask for an appointment for sports medicine!   In the meantime, ok to continue ibuprofen OR naproxen, can take these with Tylenol if needed. I sent Rx for steroid burst as well to get things to calm down. I suspect arthritis. Will see what Xray shows!      Hip Exercises Ask your health care provider which exercises are safe for you. Do exercises exactly as told by your health care provider and adjust them as directed. It is normal to feel mild stretching, pulling, tightness, or discomfort as you do these exercises. Stop right away if you feel sudden pain or your pain gets worse. Do not begin these exercises until told by your health care provider. Stretching and range-of-motion exercises These exercises warm up your muscles and joints and improve the movement and flexibility of your hip. These exercises also help to relieve pain, numbness, and tingling. You may be asked to limit your range of motion if you had a hip replacement. Talk to your health care provider about these restrictions. Hamstrings, supine  1. Lie on your back (supine position). 2. Loop a belt or towel over the ball of your left / right foot. The ball of your foot is on the walking surface, right under your toes. 3. Straighten your left / right knee and slowly pull on the belt or towel to raise your leg until you feel a gentle stretch behind your knee (hamstring). ? Do not let your knee bend while you do this. ? Keep your other leg flat on the floor. 4. Hold this position for __________ seconds. 5. Slowly return your leg to the starting position. Repeat __________ times. Complete this exercise __________ times a day. Hip rotation  1. Lie on your back on a firm surface. 2. With your left / right hand, gently pull  your left / right knee toward the shoulder that is on the same side of the body. Stop when your knee is pointing toward the ceiling. 3. Hold your left / right ankle with your other hand. 4. Keeping your knee steady, gently pull your left / right ankle toward your other shoulder until you feel a stretch in your buttocks. ? Keep your hips and shoulders firmly planted while you do this stretch. 5. Hold this position for __________ seconds. Repeat __________ times. Complete this exercise __________ times a day. Seated stretch This exercise is sometimes called hamstrings and adductors stretch. 1. Sit on the floor with your legs stretched wide. Keep your knees straight during this exercise. 2. Keeping your head and back in a straight line, bend at your waist to reach for your left foot (position A). You should feel a stretch in your right inner thigh (adductors). 3. Hold this position for __________ seconds. Then slowly return to the upright position. 4. Keeping your head and back in a straight line, bend at your waist to reach forward (position B). You should feel a stretch behind both of your thighs and knees (hamstrings). 5. Hold this position for __________ seconds. Then slowly return to the upright position. 6. Keeping your head and back in a straight line, bend at your waist to reach for your right foot (position C). You should feel a stretch in your left inner thigh (adductors). 7. Hold  this position for __________ seconds. Then slowly return to the upright position. Repeat __________ times. Complete this exercise __________ times a day. Lunge This exercise stretches the muscles of the hip (hip flexors). 1. Place your left / right knee on the floor and bend your other knee so that is directly over your ankle. You should be half-kneeling. 2. Keep good posture with your head over your shoulders. 3. Tighten your buttocks to point your tailbone downward. This will prevent your back from arching too  much. 4. You should feel a gentle stretch in the front of your left / right thigh and hip. If you do not feel a stretch, slide your other foot forward slightly and then slowly lunge forward with your chest up until your knee once again lines up over your ankle. ? Make sure your tailbone continues to point downward. 5. Hold this position for __________ seconds. 6. Slowly return to the starting position. Repeat __________ times. Complete this exercise __________ times a day. Strengthening exercises These exercises build strength and endurance in your hip. Endurance is the ability to use your muscles for a long time, even after they get tired. Bridge This exercise strengthens the muscles of your hip (hip extensors). 1. Lie on your back on a firm surface with your knees bent and your feet flat on the floor. 2. Tighten your buttocks muscles and lift your bottom off the floor until the trunk of your body and your hips are level with your thighs. ? Do not arch your back. ? You should feel the muscles working in your buttocks and the back of your thighs. If you do not feel these muscles, slide your feet 1-2 inches (2.5-5 cm) farther away from your buttocks. 3. Hold this position for __________ seconds. 4. Slowly lower your hips to the starting position. 5. Let your muscles relax completely between repetitions. Repeat __________ times. Complete this exercise __________ times a day. Straight leg raises, side-lying This exercise strengthens the muscles that move the hip joint away from the center of the body (hip abductors). 1. Lie on your side with your left / right leg in the top position. Lie so your head, shoulder, hip, and knee line up. You may bend your bottom knee slightly to help you balance. 2. Roll your hips slightly forward, so your hips are stacked directly over each other and your left / right knee is facing forward. 3. Leading with your heel, lift your top leg 4-6 inches (10-15 cm). You  should feel the muscles in your top hip lifting. ? Do not let your foot drift forward. ? Do not let your knee roll toward the ceiling. 4. Hold this position for __________ seconds. 5. Slowly return to the starting position. 6. Let your muscles relax completely between repetitions. Repeat __________ times. Complete this exercise __________ times a day. Straight leg raises, side-lying This exercise strengthen the muscles that move the hip joint toward the center of the body (hip adductors). 1. Lie on your side with your left / right leg in the bottom position. Lie so your head, shoulder, hip, and knee line up. You may place your upper foot in front to help you balance. 2. Roll your hips slightly forward, so your hips are stacked directly over each other and your left / right knee is facing forward. 3. Tense the muscles in your inner thigh and lift your bottom leg 4-6 inches (10-15 cm). 4. Hold this position for __________ seconds. 5. Slowly return to the  starting position. 6. Let your muscles relax completely between repetitions. Repeat __________ times. Complete this exercise __________ times a day. Straight leg raises, supine This exercise strengthens the muscles in the front of your thigh (quadriceps). 1. Lie on your back (supine position) with your left / right leg extended and your other knee bent. 2. Tense the muscles in the front of your left / right thigh. You should see your kneecap slide up or see increased dimpling just above your knee. 3. Keep these muscles tight as you raise your leg 4-6 inches (10-15 cm) off the floor. Do not let your knee bend. 4. Hold this position for __________ seconds. 5. Keep these muscles tense as you lower your leg. 6. Relax the muscles slowly and completely between repetitions. Repeat __________ times. Complete this exercise __________ times a day. Hip abductors, standing This exercise strengthens the muscles that move the leg and hip joint away from  the center of the body (hip abductors). 1. Tie one end of a rubber exercise band or tubing to a secure surface, such as a chair, table, or pole. 2. Loop the other end of the band or tubing around your left / right ankle. 3. Keeping your ankle with the band or tubing directly opposite the secured end, step away until there is tension in the tubing or band. Hold on to a chair, table, or pole as needed for balance. 4. Lift your left / right leg out to your side. While you do this: ? Keep your back upright. ? Keep your shoulders over your hips. ? Keep your toes pointing forward. ? Make sure to use your hip muscles to slowly lift your leg. Do not tip your body or forcefully lift your leg. 5. Hold this position for __________ seconds. 6. Slowly return to the starting position. Repeat __________ times. Complete this exercise __________ times a day. Squats This exercise strengthens the muscles in the front of your thigh (quadriceps). 1. Stand in a door frame so your feet and knees are in line with the frame. You may place your hands on the frame for balance. 2. Slowly bend your knees and lower your hips like you are going to sit in a chair. ? Keep your lower legs in a straight-up-and-down position. ? Do not let your hips go lower than your knees. ? Do not bend your knees lower than told by your health care provider. ? If your hip pain increases, do not bend as low. 3. Hold this position for ___________ seconds. 4. Slowly push with your legs to return to standing. Do not use your hands to pull yourself to standing. Repeat __________ times. Complete this exercise __________ times a day. This information is not intended to replace advice given to you by your health care provider. Make sure you discuss any questions you have with your health care provider. Document Released: 11/30/2005 Document Revised: 09/23/2018 Document Reviewed: 09/23/2018 Elsevier Patient Education  2020 [employer]

## 2019-07-08 ENCOUNTER — Encounter: Payer: Self-pay | Admitting: Osteopathic Medicine

## 2019-08-06 ENCOUNTER — Other Ambulatory Visit: Payer: Self-pay | Admitting: Osteopathic Medicine

## 2019-08-06 DIAGNOSIS — E119 Type 2 diabetes mellitus without complications: Secondary | ICD-10-CM

## 2019-08-08 ENCOUNTER — Other Ambulatory Visit: Payer: Self-pay | Admitting: Osteopathic Medicine

## 2019-08-08 DIAGNOSIS — I10 Essential (primary) hypertension: Secondary | ICD-10-CM

## 2019-08-08 DIAGNOSIS — E119 Type 2 diabetes mellitus without complications: Secondary | ICD-10-CM

## 2019-08-10 ENCOUNTER — Telehealth: Payer: Self-pay | Admitting: Osteopathic Medicine

## 2019-08-10 DIAGNOSIS — Z0000 Encounter for general adult medical examination without abnormal findings: Secondary | ICD-10-CM

## 2019-08-10 DIAGNOSIS — E119 Type 2 diabetes mellitus without complications: Secondary | ICD-10-CM

## 2019-08-10 NOTE — Telephone Encounter (Signed)
Patient needs refills on Glipizide 5mg , Metformin 1000mg  and Hydrochlorothiazide 12.5mg .  Patient has follow-up on 07/06/19.

## 2019-08-11 MED ORDER — HYDROCHLOROTHIAZIDE 12.5 MG PO TABS: 12.5000 mg | ORAL_TABLET | Freq: Every day | ORAL | 3 refills | Status: DC

## 2019-08-11 MED ORDER — METFORMIN HCL 1000 MG PO TABS: 1000.0000 mg | ORAL_TABLET | Freq: Two times a day (BID) | ORAL | 3 refills | Status: DC

## 2019-08-11 MED ORDER — GLIPIZIDE 5 MG PO TABS: 5.0000 mg | ORAL_TABLET | Freq: Every day | ORAL | 3 refills | Status: DC

## 2019-08-11 NOTE — Telephone Encounter (Signed)
Oh yes he's overdue for routine DM2 follow-up / A1C (he comes in for acute visits fairly often but hasn't had routine follow-up on chronic issues in some time, good catch).   Can we schedule him for labs and annual sometime in next few months? Orders are in if he'd like to get labs ahead of time and can do virtual visit for physical / preventive care   Thanks!

## 2019-08-12 ENCOUNTER — Other Ambulatory Visit: Payer: Self-pay | Admitting: Osteopathic Medicine

## 2019-08-12 DIAGNOSIS — E119 Type 2 diabetes mellitus without complications: Secondary | ICD-10-CM

## 2019-08-13 NOTE — Telephone Encounter (Signed)
Pt advised. He will get labs done. He will call back to schedule annual.

## 2019-08-21 ENCOUNTER — Other Ambulatory Visit: Payer: Self-pay | Admitting: Osteopathic Medicine

## 2019-08-21 DIAGNOSIS — I10 Essential (primary) hypertension: Secondary | ICD-10-CM

## 2019-08-21 LAB — COMPLETE METABOLIC PANEL WITH GFR
AG Ratio: 1.4 (calc) (ref 1.0–2.5)
ALT: 38 U/L (ref 9–46)
AST: 31 U/L (ref 10–35)
Albumin: 4 g/dL (ref 3.6–5.1)
Alkaline phosphatase (APISO): 74 U/L (ref 35–144)
BUN/Creatinine Ratio: 25 (calc) — ABNORMAL HIGH (ref 6–22)
BUN: 16 mg/dL (ref 7–25)
CO2: 24 mmol/L (ref 20–32)
Calcium: 9.1 mg/dL (ref 8.6–10.3)
Chloride: 107 mmol/L (ref 98–110)
Creat: 0.63 mg/dL — ABNORMAL LOW (ref 0.70–1.33)
GFR, Est African American: 127 mL/min/{1.73_m2} (ref >=60)
GFR, Est Non African American: 109 mL/min/{1.73_m2} (ref >=60)
Globulin: 2.8 g/dL (calc) (ref 1.9–3.7)
Glucose, Bld: 141 mg/dL — ABNORMAL HIGH (ref 65–99)
Potassium: 4 mmol/L (ref 3.5–5.3)
Sodium: 138 mmol/L (ref 135–146)
Total Bilirubin: 1 mg/dL (ref 0.2–1.2)
Total Protein: 6.8 g/dL (ref 6.1–8.1)

## 2019-08-21 LAB — CBC
HCT: 40.7 % (ref 38.5–50.0)
Hemoglobin: 13.7 g/dL (ref 13.2–17.1)
MCH: 29.9 pg (ref 27.0–33.0)
MCHC: 33.7 g/dL (ref 32.0–36.0)
MCV: 88.9 fL (ref 80.0–100.0)
MPV: 11.7 fL (ref 7.5–12.5)
Platelets: 85 10*3/uL — ABNORMAL LOW (ref 140–400)
RBC: 4.58 10*6/uL (ref 4.20–5.80)
RDW: 14.2 % (ref 11.0–15.0)
WBC: 3.8 10*3/uL (ref 3.8–10.8)

## 2019-08-21 LAB — LIPID PANEL
Cholesterol: 92 mg/dL (ref <200)
HDL: 42 mg/dL (ref >=40)
LDL Cholesterol (Calc): 37 mg/dL (calc)
Non-HDL Cholesterol (Calc): 50 mg/dL (calc) (ref <130)
Total CHOL/HDL Ratio: 2.2 (calc) (ref <5.0)
Triglycerides: 58 mg/dL (ref <150)

## 2019-08-21 LAB — HEMOGLOBIN A1C
Hgb A1c MFr Bld: 7 % of total Hgb — ABNORMAL HIGH (ref <5.7)
Mean Plasma Glucose: 154 (calc)
eAG (mmol/L): 8.5 (calc)

## 2019-08-21 LAB — PSA, TOTAL WITH REFLEX TO PSA, FREE: PSA, Total: 0.4 ng/mL (ref <=4.0)

## 2019-08-26 ENCOUNTER — Other Ambulatory Visit: Payer: Self-pay

## 2019-08-26 DIAGNOSIS — E119 Type 2 diabetes mellitus without complications: Secondary | ICD-10-CM

## 2019-08-26 MED ORDER — GLIPIZIDE 5 MG PO TABS: 5.0000 mg | ORAL_TABLET | Freq: Two times a day (BID) | ORAL | 3 refills | Status: DC

## 2019-09-21 ENCOUNTER — Other Ambulatory Visit: Payer: Self-pay | Admitting: Family Medicine

## 2019-09-21 DIAGNOSIS — I10 Essential (primary) hypertension: Secondary | ICD-10-CM

## 2019-09-24 ENCOUNTER — Telehealth: Payer: Self-pay | Admitting: Osteopathic Medicine

## 2019-09-24 ENCOUNTER — Other Ambulatory Visit: Payer: Self-pay | Admitting: Family Medicine

## 2019-09-24 DIAGNOSIS — I10 Essential (primary) hypertension: Secondary | ICD-10-CM

## 2019-09-24 NOTE — Telephone Encounter (Signed)
Patient states that he needs a refill on his enalapril (VASOTEC) 20 MG tablet [[id number]] until next appointment. Please advise.

## 2019-09-25 MED ORDER — ENALAPRIL MALEATE 20 MG PO TABS: 40.0000 mg | ORAL_TABLET | Freq: Every day | ORAL | 0 refills | Status: DC

## 2019-09-25 NOTE — Telephone Encounter (Signed)
BP Readings from Last 3 Encounters:  07/06/19 (!) 158/66  12/04/18 132/62  11/20/18 (!) 147/72

## 2019-09-25 NOTE — Telephone Encounter (Signed)
Sent, patient is due to come back for blood pressure recheck since last BP was not at goal.  He has an upcoming appointment on the books, note written for no additional refills if he misses that appointment /fails to reschedule

## 2019-09-30 ENCOUNTER — Encounter: Payer: Self-pay | Admitting: Osteopathic Medicine

## 2019-09-30 ENCOUNTER — Ambulatory Visit (INDEPENDENT_AMBULATORY_CARE_PROVIDER_SITE_OTHER): Payer: PRIVATE HEALTH INSURANCE | Admitting: Osteopathic Medicine

## 2019-09-30 VITALS — Wt 220.0 lb

## 2019-09-30 DIAGNOSIS — E119 Type 2 diabetes mellitus without complications: Secondary | ICD-10-CM

## 2019-09-30 DIAGNOSIS — M545 Low back pain, unspecified: Secondary | ICD-10-CM

## 2019-09-30 DIAGNOSIS — G8929 Other chronic pain: Secondary | ICD-10-CM | POA: Diagnosis not present

## 2019-09-30 DIAGNOSIS — I10 Essential (primary) hypertension: Secondary | ICD-10-CM | POA: Diagnosis not present

## 2019-09-30 NOTE — Progress Notes (Signed)
Virtual Visit via Phone    I connected with      Jeffrey Nichols on 09/30/19 at 3:45 PM by a telemedicine application and verified that I am speaking with the correct person using two identifiers.  Patient is at home I am working from home    I discussed the limitations of evaluation and management by telemedicine and the availability of in person appointments. The patient expressed understanding and agreed to proceed.  History of Present Illness: Jeffrey Nichols is a 58 y.o. male who would like to discuss  Chief Complaint  Patient presents with  . Medication Refill - DM2  . Back Pain    Naproxen not working  . Insomnia    Intolerance to Trazadone   MSK pain  . Location: lower back and left hip more so than right . Quality: soreness, sometimes sharp  . Duration: >1 year  . Context: known arthritis. History of R leg injury, torn ligaments in the past (many years ago was playing softball, came down from a catch, landed in a hole, never needed surgery. Was in a cast for 2 months).  . Modifying factors: naproxen, ibuprofen, prednisone all help slightly but pain comes back. Chiropractic minimally helpful . Assoc signs/symptoms: hamstring tightness   Also has concern for recurrence of abscess in groin.  This has been an ongoing issue for him, treated with Keflex several months ago.    Observations/Objective: Wt 220 lb (99.8 kg)   BMI 31.57 kg/m  BP Readings from Last 3 Encounters:  07/06/19 (!) 158/66  12/04/18 132/62  11/20/18 (!) 147/72   Exam: Normal Speech.  NAD  Lab and Radiology Results No results found for this or any previous visit (from the past 72 hour(s)). No results found.     Assessment and Plan: 58 y.o. male with The primary encounter diagnosis was Essential hypertension. Diagnoses of Type 2 diabetes mellitus without complication (HCC) and Chronic bilateral low back pain, unspecified whether sciatica present were also pertinent to this visit.  Sounds  like a complicated musculoskeletal history involving some previous injury of what sounds like a hamstring tear.  I think having him follow-up with Dr. Karie Schwalbe to see if we need MRI of the lumbar spine plus or minus MRI of the hip, whether his issues particularly the hip pain might be amenable to injection in the office, or if any other treatment/recommendations from Dr. Karie Schwalbe.  Him that I would be happy to fill out handicap form for him for his car/parking, I will not be in the office today or tomorrow but I will take care of it on Friday.   PDMP not reviewed this encounter. No orders of the defined types were placed in this encounter.  Meds ordered this encounter  Medications  . atorvastatin (LIPITOR) 40 MG tablet    Sig: Take 1 tablet (40 mg total) by mouth daily.    Dispense:  90 tablet    Refill:  3  . enalapril (VASOTEC) 20 MG tablet    Sig: Take 2 tablets (40 mg total) by mouth daily.    Dispense:  180 tablet    Refill:  1  . glipiZIDE (GLUCOTROL) 5 MG tablet    Sig: Take 1 tablet (5 mg total) by mouth 2 (two) times daily.    Dispense:  180 tablet    Refill:  1    Please disregard last rx sent on 08/25/19.  . hydrochlorothiazide (HYDRODIURIL) 12.5 MG tablet    Sig: Take 1  tablet (12.5 mg total) by mouth daily.    Dispense:  90 tablet    Refill:  1  . metFORMIN (GLUCOPHAGE) 1000 MG tablet    Sig: Take 1 tablet (1,000 mg total) by mouth 2 (two) times daily with a meal.    Dispense:  180 tablet    Refill:  1  . doxycycline (VIBRA-TABS) 100 MG tablet    Sig: Take 1 tablet (100 mg total) by mouth 2 (two) times daily for 10 days.    Dispense:  20 tablet    Refill:  0      Follow Up Instructions: Return for VISIT WITH SPORTS MEDICINE FOR ORTHOPEDIC ISSUE .    I discussed the assessment and treatment plan with the patient. The patient was provided an opportunity to ask questions and all were answered. The patient agreed with the plan and demonstrated an understanding of the  instructions.   The patient was advised to call back or seek an in-person evaluation if any new concerns, if symptoms worsen or if the condition fails to improve as anticipated.  25 minutes of non-face-to-face time was provided during this encounter.                      Historical information moved to improve visibility of documentation.  Past Medical History:  Diagnosis Date  . Diabetes mellitus ([place])   . Diabetes type 2, controlled ([place]) 10/19/2015   Past Surgical History:  Procedure Laterality Date  . ABCESS DRAINAGE     Nipple  . CHOLECYSTECTOMY    . ROTATOR CUFF REPAIR Right   . TONSILLECTOMY     Social History   Tobacco Use  . Smoking status: Current Every Day Smoker    Packs/day: 1.00    Years: 30.00    Pack years: 30.00    Types: Cigarettes  . Smokeless tobacco: Never Used  Substance Use Topics  . Alcohol use: Yes    Alcohol/week: 0.0 standard drinks   family history includes Bladder Cancer in his father; Diabetes in his father and mother; Skin cancer in his father.  Medications: Current Outpatient Medications  Medication Sig Dispense Refill  . atorvastatin (LIPITOR) 40 MG tablet Take 1 tablet (40 mg total) by mouth daily. 90 tablet 3  . enalapril (VASOTEC) 20 MG tablet Take 2 tablets (40 mg total) by mouth daily. 180 tablet 1  . glipiZIDE (GLUCOTROL) 5 MG tablet Take 1 tablet (5 mg total) by mouth 2 (two) times daily. 180 tablet 1  . hydrochlorothiazide (HYDRODIURIL) 12.5 MG tablet Take 1 tablet (12.5 mg total) by mouth daily. 90 tablet 1  . metFORMIN (GLUCOPHAGE) 1000 MG tablet Take 1 tablet (1,000 mg total) by mouth 2 (two) times daily with a meal. 180 tablet 1  . doxycycline (VIBRA-TABS) 100 MG tablet Take 1 tablet (100 mg total) by mouth 2 (two) times daily for 10 days. 20 tablet 0  . naproxen (NAPROSYN) 500 MG tablet Take 1 tablet (500 mg total) by mouth 2 (two) times daily with a meal. Take every day for one week, then use as needed  after that (Patient not taking: Reported on 07/06/2019) 60 tablet 1   No current facility-administered medications for this visit.    Allergies  Allergen Reactions  . Trazodone And Nefazodone     As per pt, made him feel "funny".  . Meloxicam Other (See Comments)    headache     PDMP not reviewed this encounter. No orders of the  defined types were placed in this encounter.  Meds ordered this encounter  Medications  . atorvastatin (LIPITOR) 40 MG tablet    Sig: Take 1 tablet (40 mg total) by mouth daily.    Dispense:  90 tablet    Refill:  3  . enalapril (VASOTEC) 20 MG tablet    Sig: Take 2 tablets (40 mg total) by mouth daily.    Dispense:  180 tablet    Refill:  1  . glipiZIDE (GLUCOTROL) 5 MG tablet    Sig: Take 1 tablet (5 mg total) by mouth 2 (two) times daily.    Dispense:  180 tablet    Refill:  1    Please disregard last rx sent on 08/25/19.  . hydrochlorothiazide (HYDRODIURIL) 12.5 MG tablet    Sig: Take 1 tablet (12.5 mg total) by mouth daily.    Dispense:  90 tablet    Refill:  1  . metFORMIN (GLUCOPHAGE) 1000 MG tablet    Sig: Take 1 tablet (1,000 mg total) by mouth 2 (two) times daily with a meal.    Dispense:  180 tablet    Refill:  1  . doxycycline (VIBRA-TABS) 100 MG tablet    Sig: Take 1 tablet (100 mg total) by mouth 2 (two) times daily for 10 days.    Dispense:  20 tablet    Refill:  0

## 2019-10-01 ENCOUNTER — Telehealth: Payer: Self-pay | Admitting: Osteopathic Medicine

## 2019-10-01 MED ORDER — METFORMIN HCL 1000 MG PO TABS: 1000.0000 mg | ORAL_TABLET | Freq: Two times a day (BID) | ORAL | 1 refills | Status: DC

## 2019-10-01 MED ORDER — DOXYCYCLINE HYCLATE 100 MG PO TABS: 100.0000 mg | ORAL_TABLET | Freq: Two times a day (BID) | ORAL | 0 refills | Status: AC

## 2019-10-01 MED ORDER — GLIPIZIDE 5 MG PO TABS: 5.0000 mg | ORAL_TABLET | Freq: Two times a day (BID) | ORAL | 1 refills | Status: DC

## 2019-10-01 MED ORDER — ATORVASTATIN CALCIUM 40 MG PO TABS: 40.0000 mg | ORAL_TABLET | Freq: Every day | ORAL | 3 refills | Status: DC

## 2019-10-01 MED ORDER — ENALAPRIL MALEATE 20 MG PO TABS: 40.0000 mg | ORAL_TABLET | Freq: Every day | ORAL | 1 refills | Status: DC

## 2019-10-01 MED ORDER — HYDROCHLOROTHIAZIDE 12.5 MG PO TABS: 12.5000 mg | ORAL_TABLET | Freq: Every day | ORAL | 1 refills | Status: DC

## 2019-10-01 NOTE — Telephone Encounter (Signed)
Give patient my apologies, I think I was seeing him when my chart system shut down on my computer, I had all his medications pended and thought they had sent but I did not go back and double check after I got my computer back up and running.  Totally my fault, everything should be at the pharmacy now.   I had however told him that I would not be back in the office today but would have a handicap form ready for him on Friday, we can mail it to him or he can pick it up when he is here to see Dr. Darene Lamer

## 2019-10-01 NOTE — Telephone Encounter (Signed)
Patient states that he needs his handicap sticker. Did not find it in basket up front or bin in the back. Also patient states that he did not get any medications filled that were discussed at virtual visit with PCP. Patient will be in the office tomorrow to see Dr.Thekkekandam. Please advise.

## 2019-10-02 ENCOUNTER — Encounter: Payer: Self-pay | Admitting: Sports Medicine

## 2019-10-02 ENCOUNTER — Ambulatory Visit (INDEPENDENT_AMBULATORY_CARE_PROVIDER_SITE_OTHER): Payer: PRIVATE HEALTH INSURANCE | Admitting: Sports Medicine

## 2019-10-02 ENCOUNTER — Other Ambulatory Visit: Payer: Self-pay

## 2019-10-02 ENCOUNTER — Ambulatory Visit: Payer: PRIVATE HEALTH INSURANCE | Admitting: Sports Medicine

## 2019-10-02 ENCOUNTER — Ambulatory Visit (INDEPENDENT_AMBULATORY_CARE_PROVIDER_SITE_OTHER): Payer: PRIVATE HEALTH INSURANCE

## 2019-10-02 DIAGNOSIS — G8929 Other chronic pain: Secondary | ICD-10-CM | POA: Diagnosis not present

## 2019-10-02 DIAGNOSIS — M4807 Spinal stenosis, lumbosacral region: Secondary | ICD-10-CM | POA: Diagnosis not present

## 2019-10-02 DIAGNOSIS — M5416 Radiculopathy, lumbar region: Secondary | ICD-10-CM | POA: Diagnosis not present

## 2019-10-02 DIAGNOSIS — M48061 Spinal stenosis, lumbar region without neurogenic claudication: Secondary | ICD-10-CM | POA: Insufficient documentation

## 2019-10-02 DIAGNOSIS — M25571 Pain in right ankle and joints of right foot: Secondary | ICD-10-CM | POA: Diagnosis not present

## 2019-10-02 MED ORDER — GABAPENTIN 300 MG PO CAPS: ORAL_CAPSULE | ORAL | 3 refills | Status: DC

## 2019-10-02 MED ORDER — PREDNISONE 50 MG PO TABS: ORAL_TABLET | ORAL | 0 refills | Status: DC

## 2019-10-02 NOTE — Assessment & Plan Note (Signed)
Etiology, possibly arthritic. X-rays, MRI, he has failed greater than 6 weeks of conservative measures.

## 2019-10-02 NOTE — Progress Notes (Signed)
Subjective:    CC: Back pain, leg pain  HPI: This is a pleasant 58 year old male, for years has had pain in his low back with radiation down the buttock, posterior thigh lateral lower leg to the lateral ankle and to the second through fourth toes.  Worse with sitting, flexion, Valsalva, no bowel or bladder dysfunction, saddle numbness, constitutional symptoms.  He has had anti-inflammatories, chiropractic manipulation totaling greater than 6 weeks of conservative measures.  In addition he has pain in his right ankle, lateral aspect with swelling, present also for greater than 6 weeks in spite of conservative measures.  I reviewed the past medical history, family history, social history, surgical history, and allergies today and no changes were needed.  Please see the problem list section below in epic for further details.  Past Medical History: Past Medical History:  Diagnosis Date  . Diabetes mellitus (HCC)   . Diabetes type 2, controlled (HCC) 10/19/2015   Past Surgical History: Past Surgical History:  Procedure Laterality Date  . ABCESS DRAINAGE     Nipple  . CHOLECYSTECTOMY    . ROTATOR CUFF REPAIR Right   . TONSILLECTOMY     Social History: Social History   Socioeconomic History  . Marital status: Married    Spouse name: Not on file  . Number of children: Not on file  . Years of education: Not on file  . Highest education level: Not on file  Occupational History  . Not on file  Social Needs  . Financial resource strain: Not on file  . Food insecurity    Worry: Not on file    Inability: Not on file  . Transportation needs    Medical: Not on file    Non-medical: Not on file  Tobacco Use  . Smoking status: Current Every Day Smoker    Packs/day: 1.00    Years: 30.00    Pack years: 30.00    Types: Cigarettes  . Smokeless tobacco: Never Used  Substance and Sexual Activity  . Alcohol use: Yes    Alcohol/week: 0.0 standard drinks  . Drug use: No  . Sexual  activity: Not Currently    Partners: Female  Lifestyle  . Physical activity    Days per week: Not on file    Minutes per session: Not on file  . Stress: Not on file  Relationships  . Social Musician on phone: Not on file    Gets together: Not on file    Attends religious service: Not on file    Active member of club or organization: Not on file    Attends meetings of clubs or organizations: Not on file    Relationship status: Not on file  Other Topics Concern  . Not on file  Social History Narrative  . Not on file   Family History: Family History  Problem Relation Age of Onset  . Bladder Cancer Father   . Skin cancer Father   . Diabetes Father   . Diabetes Mother    Allergies: Allergies  Allergen Reactions  . Trazodone And Nefazodone     As per pt, made him feel "funny".  . Meloxicam Other (See Comments)    headache    Medications: See med rec.  Review of Systems: No fevers, chills, night sweats, weight loss, chest pain, or shortness of breath.   Objective:    General: Well Developed, well nourished, and in no acute distress.  Neuro: Alert and oriented x3, extra-ocular muscles  intact, sensation grossly intact.  HEENT: Normocephalic, atraumatic, pupils equal round reactive to light, neck supple, no masses, no lymphadenopathy, thyroid nonpalpable.  Skin: Warm and dry, no rashes. Cardiac: Regular rate and rhythm, no murmurs rubs or gallops, no lower extremity edema.  Respiratory: Clear to auscultation bilaterally. Not using accessory muscles, speaking in full sentences. Back Exam:  Inspection: Unremarkable  Motion: Flexion 45 deg, Extension 45 deg, Side Bending to 45 deg bilaterally,  Rotation to 45 deg bilaterally  SLR laying: Negative  XSLR laying: Negative  Palpable tenderness: None. FABER: negative. Sensory change: Gross sensation intact to all lumbar and sacral dermatomes.  Reflexes: 2+ at both patellar tendons, 2+ at achilles tendons,  Babinski's downgoing.  Strength at foot  Plantar-flexion: 5/5 Dorsi-flexion: 5/5 Eversion: 5/5 Inversion: 5/5  Leg strength  Quad: 5/5 Hamstring: 5/5 Hip flexor: 5/5 Hip abductors: 5/5  Gait unremarkable. Right ankle: Mildly swollen Range of motion is full in all directions. Strength is 5/5 in all directions. Stable lateral and medial ligaments; squeeze test and kleiger test unremarkable; Talar dome nontender; No pain at base of 5th MT; No tenderness over cuboid; No tenderness over N spot or navicular prominence No tenderness on posterior aspects of lateral and medial malleolus No sign of peroneal tendon subluxations; Negative tarsal tunnel tinel's Able to walk 4 steps.  Impression and Recommendations:    Chronic pain of right ankle Etiology, possibly arthritic. X-rays, MRI, he has failed greater than 6 weeks of conservative measures.  Right lumbar radiculitis Discogenic axial back pain with right-sided L5 distribution radiculitis. He has failed greater than 6 weeks of conservative measures including chiropractic manipulation. Multiple possible pain generators including the right sacroiliac joint, multilevel lumbar DDD. At this point we are going to treat him aggressively with 5 days of prednisone, gabapentin and an up taper, formal PT, MRI. Return to see me to go over MRI results and discuss epidural planning.   ___________________________________________ Gwen Her. Dianah Field, M.D., ABFM., CAQSM. Primary Care and Sports Medicine [facility]  Adjunct Professor of [employer]

## 2019-10-02 NOTE — Telephone Encounter (Signed)
Completed form given to Naylor at front desk.

## 2019-10-02 NOTE — Assessment & Plan Note (Signed)
Discogenic axial back pain with right-sided L5 distribution radiculitis. He has failed greater than 6 weeks of conservative measures including chiropractic manipulation. Multiple possible pain generators including the right sacroiliac joint, multilevel lumbar DDD. At this point we are going to treat him aggressively with 5 days of prednisone, gabapentin and an up taper, formal PT, MRI. Return to see me to go over MRI results and discuss epidural planning.

## 2019-10-02 NOTE — Telephone Encounter (Signed)
Let patient know of information below. He will be coming in today to pick it up at appointment with Dr.T at 1:30pm. No further questions at this time.

## 2019-10-09 ENCOUNTER — Other Ambulatory Visit: Payer: Self-pay | Admitting: Sports Medicine

## 2019-10-09 DIAGNOSIS — Z1389 Encounter for screening for other disorder: Secondary | ICD-10-CM

## 2019-10-09 DIAGNOSIS — Z0189 Encounter for other specified special examinations: Secondary | ICD-10-CM

## 2019-10-10 ENCOUNTER — Other Ambulatory Visit: Payer: Self-pay | Admitting: Sports Medicine

## 2019-10-10 MED ORDER — TRIAZOLAM 0.25 MG PO TABS: ORAL_TABLET | ORAL | 0 refills | Status: DC

## 2019-10-11 ENCOUNTER — Other Ambulatory Visit: Payer: PRIVATE HEALTH INSURANCE

## 2019-10-18 ENCOUNTER — Other Ambulatory Visit: Payer: Self-pay

## 2019-10-18 ENCOUNTER — Ambulatory Visit (INDEPENDENT_AMBULATORY_CARE_PROVIDER_SITE_OTHER): Payer: PRIVATE HEALTH INSURANCE

## 2019-10-18 DIAGNOSIS — M25571 Pain in right ankle and joints of right foot: Secondary | ICD-10-CM

## 2019-10-18 DIAGNOSIS — M5416 Radiculopathy, lumbar region: Secondary | ICD-10-CM | POA: Diagnosis not present

## 2019-10-18 DIAGNOSIS — Z1389 Encounter for screening for other disorder: Secondary | ICD-10-CM | POA: Diagnosis not present

## 2019-10-18 DIAGNOSIS — Z0189 Encounter for other specified special examinations: Secondary | ICD-10-CM

## 2019-10-18 DIAGNOSIS — G8929 Other chronic pain: Secondary | ICD-10-CM | POA: Diagnosis not present

## 2019-10-18 DIAGNOSIS — M4807 Spinal stenosis, lumbosacral region: Secondary | ICD-10-CM

## 2019-11-09 ENCOUNTER — Telehealth: Payer: Self-pay | Admitting: Sports Medicine

## 2019-11-09 DIAGNOSIS — G8929 Other chronic pain: Secondary | ICD-10-CM

## 2019-11-09 DIAGNOSIS — M25571 Pain in right ankle and joints of right foot: Secondary | ICD-10-CM

## 2019-11-09 NOTE — Telephone Encounter (Signed)
Patient reports that the last time you talked with him about Orthotics he was going to go to his previous podiatrist and they are no longer in business. He is wanting to know if you know of any place in [place] that can make him Orthotics. Please advise.

## 2019-11-09 NOTE — Telephone Encounter (Signed)
We can use [facility] in [place], this is a send out location where it can take several weeks to a month or more to get the orthotics back, or we can use Dr. Clearance Coots in [facility].  He can get the orthotics same day.

## 2019-11-10 NOTE — Telephone Encounter (Signed)
Patient is aware and did not have any questions at this time.

## 2019-11-10 NOTE — Telephone Encounter (Signed)
No problem, referral placed for [facility].

## 2019-11-10 NOTE — Telephone Encounter (Signed)
He only wants to go to [place]. [place] is way out of his way and he does not want to go there at this time.

## 2019-11-16 ENCOUNTER — Ambulatory Visit (INDEPENDENT_AMBULATORY_CARE_PROVIDER_SITE_OTHER): Payer: PRIVATE HEALTH INSURANCE | Admitting: Sports Medicine

## 2019-11-16 ENCOUNTER — Other Ambulatory Visit: Payer: Self-pay

## 2019-11-16 ENCOUNTER — Encounter: Payer: Self-pay | Admitting: Sports Medicine

## 2019-11-16 DIAGNOSIS — M5416 Radiculopathy, lumbar region: Secondary | ICD-10-CM

## 2019-11-16 DIAGNOSIS — M7671 Peroneal tendinitis, right leg: Secondary | ICD-10-CM

## 2019-11-16 NOTE — Assessment & Plan Note (Signed)
With a split tear of the peroneal tendons, injection as above, return to see me in a month for this. Part of his pain is also radicular in origin.

## 2019-11-16 NOTE — Assessment & Plan Note (Signed)
Discogenic axial back pain improved a little bit with prednisone but continues to have right-sided L5 distribution radiculitis. MRI confirms compression of the right L5 nerve root, proceeding with a right L5-S1 transforaminal epidural, return to see me 1 month after injection.

## 2019-11-16 NOTE — Progress Notes (Signed)
Subjective:    CC: Follow-up  HPI: Back pain, ankle pain: Jeffrey Nichols is a pleasant 58 year old male, we have been treating for ankle pain, back pain, suspected to be primarily a radiculitis.  He returns, he had a partial improvement in his axial back pain with prednisone but continues to have right-sided foot numbness and tingling, with radicular pain.  Numbness and tingling goes to the middle toes and the small toe, pain is in the lateral ankle behind the malleolus.  I reviewed the past medical history, family history, social history, surgical history, and allergies today and no changes were needed.  Please see the problem list section below in epic for further details.  Past Medical History: Past Medical History:  Diagnosis Date   Diabetes mellitus (HCC)    Diabetes type 2, controlled (HCC) 10/19/2015   Past Surgical History: Past Surgical History:  Procedure Laterality Date   ABCESS DRAINAGE     Nipple   CHOLECYSTECTOMY     ROTATOR CUFF REPAIR Right    TONSILLECTOMY     Social History: Social History   Socioeconomic History   Marital status: Married    Spouse name: Not on file   Number of children: Not on file   Years of education: Not on file   Highest education level: Not on file  Occupational History   Not on file  Tobacco Use   Smoking status: Current Every Day Smoker    Packs/day: 1.00    Years: 30.00    Pack years: 30.00    Types: Cigarettes   Smokeless tobacco: Never Used  Substance and Sexual Activity   Alcohol use: Yes    Alcohol/week: 0.0 standard drinks   Drug use: No   Sexual activity: Not Currently    Partners: Female  Other Topics Concern   Not on file  Social History Narrative   Not on file   Social Determinants of Health   Financial Resource Strain:    Difficulty of Paying Living Expenses: Not on file  Food Insecurity:    Worried About Running Out of Food in the Last Year: Not on file   Ran Out of Food in the Last Year:  Not on file  Transportation Needs:    Lack of Transportation (Medical): Not on file   Lack of Transportation (Non-Medical): Not on file  Physical Activity:    Days of Exercise per Week: Not on file   Minutes of Exercise per Session: Not on file  Stress:    Feeling of Stress : Not on file  Social Connections:    Frequency of Communication with Friends and Family: Not on file   Frequency of Social Gatherings with Friends and Family: Not on file   Attends Religious Services: Not on file   Active Member of Clubs or Organizations: Not on file   Attends Banker Meetings: Not on file   Marital Status: Not on file   Family History: Family History  Problem Relation Age of Onset   Bladder Cancer Father    Skin cancer Father    Diabetes Father    Diabetes Mother    Allergies: Allergies  Allergen Reactions   Trazodone And Nefazodone     As per pt, made him feel "funny".   Meloxicam Other (See Comments)    headache    Medications: See med rec.  Review of Systems: No fevers, chills, night sweats, weight loss, chest pain, or shortness of breath.   Objective:    General: Well Developed,  well nourished, and in no acute distress.  Neuro: Alert and oriented x3, extra-ocular muscles intact, sensation grossly intact.  HEENT: Normocephalic, atraumatic, pupils equal round reactive to light, neck supple, no masses, no lymphadenopathy, thyroid nonpalpable.  Skin: Warm and dry, no rashes. Cardiac: Regular rate and rhythm, no murmurs rubs or gallops, no lower extremity edema.  Respiratory: Clear to auscultation bilaterally. Not using accessory muscles, speaking in full sentences. Right ankle: No visible erythema or swelling. Range of motion is full in all directions. Strength is 5/5 in all directions. Stable lateral and medial ligaments; squeeze test and kleiger test unremarkable; Talar dome nontender; No pain at base of 5th MT; No tenderness over cuboid; No  tenderness over N spot or navicular prominence Tender behind the lateral malleolus, reproduction of pain with resisted eversion. No sign of peroneal tendon subluxations; Negative tarsal tunnel tinel's Able to walk 4 steps.  Procedure: Real-time Ultrasound Guided injection of the right peroneal tendon sheath Device: Samsung HS60  Verbal informed consent obtained.  Time-out conducted.  Noted no overlying erythema, induration, or other signs of local infection.  Skin prepped in a sterile fashion.  Local anesthesia: Topical Ethyl chloride.  With sterile technique and under real time ultrasound guidance: 1 cc Kenalog 40, 1 cc lidocaine, 1 cc bupivacaine injected easily Completed without difficulty  Pain immediately resolved suggesting accurate placement of the medication.  Advised to call if fevers/chills, erythema, induration, drainage, or persistent bleeding.  Images permanently stored and available for review in the ultrasound unit.  Impression: Technically successful ultrasound guided injection.  MRI personally reviewed and shows multilevel spondylosis, clear right L5 nerve root compression.  Impression and Recommendations:    Peroneal tendinitis, right With a split tear of the peroneal tendons, injection as above, return to see me in a month for this. Part of his pain is also radicular in origin.  Right lumbar radiculitis Discogenic axial back pain improved a little bit with prednisone but continues to have right-sided L5 distribution radiculitis. MRI confirms compression of the right L5 nerve root, proceeding with a right L5-S1 transforaminal epidural, return to see me 1 month after injection.   ___________________________________________ Gwen Her. Dianah Field, M.D., ABFM., CAQSM. Primary Care and Sports Medicine [facility]  Adjunct Professor of [employer]

## 2019-11-23 ENCOUNTER — Ambulatory Visit
Admission: RE | Admit: 2019-11-23 | Discharge: 2019-11-23 | Disposition: A | Discharge status: Home / Self Care | Payer: PRIVATE HEALTH INSURANCE | Source: Ambulatory Visit | Attending: Sports Medicine | Admitting: Sports Medicine

## 2019-11-23 ENCOUNTER — Other Ambulatory Visit: Payer: Self-pay

## 2019-11-23 MED ORDER — IOPAMIDOL (ISOVUE-M 200) INJECTION 41%
1.0000 mL | Freq: Once | INTRAMUSCULAR | Status: AC
  Administered 2019-11-23: 1 mL via EPIDURAL

## 2019-11-23 MED ORDER — IOPAMIDOL (ISOVUE-M 200) INJECTION 41%: 1.0000 mL | Freq: Once | INTRAMUSCULAR | Status: DC

## 2019-11-23 MED ORDER — METHYLPREDNISOLONE ACETATE 40 MG/ML INJ SUSP (RADIOLOG
120.0000 mg | Freq: Once | INTRAMUSCULAR | Status: AC
  Administered 2019-11-23: 120 mg via EPIDURAL

## 2019-11-23 MED ORDER — METHYLPREDNISOLONE ACETATE 40 MG/ML INJ SUSP (RADIOLOG: 120.0000 mg | Freq: Once | INTRAMUSCULAR | Status: DC

## 2019-11-23 NOTE — Discharge Instructions (Signed)

## 2020-01-07 ENCOUNTER — Encounter: Payer: Self-pay | Admitting: Sports Medicine

## 2020-01-07 ENCOUNTER — Ambulatory Visit (INDEPENDENT_AMBULATORY_CARE_PROVIDER_SITE_OTHER): Payer: PRIVATE HEALTH INSURANCE | Admitting: Sports Medicine

## 2020-01-07 ENCOUNTER — Other Ambulatory Visit: Payer: Self-pay

## 2020-01-07 DIAGNOSIS — M48061 Spinal stenosis, lumbar region without neurogenic claudication: Secondary | ICD-10-CM

## 2020-01-07 MED ORDER — HYDROCODONE-ACETAMINOPHEN 10-325 MG PO TABS: 1.0000 | ORAL_TABLET | Freq: Three times a day (TID) | ORAL | 0 refills | Status: DC | PRN

## 2020-01-07 NOTE — Progress Notes (Signed)
    Procedures performed today:    None.  Independent interpretation of tests performed by another provider:   None.  Impression and Recommendations:    Lumbar spinal stenosis And he is in a difficult situation, he has severe L4-L5 lumbar spinal stenosis with right-sided L5 distribution radiculitis. We initially tried conservative treatment which did not work, gabapentin was insufficiently efficacious. An L5-S1 transforaminal selective epidural also did not provide any relief. We are now going to proceed with an interlaminar epidural on the right at L4-L5, he is going to see chiropractic manipulation. Because he has significant amount of pain him also going to add some hydrocodone high-dose. If insufficient relief after the right L4-L5 epidural in a month of chiropractic manipulation I do think he will become a candidate for a lumbar decompression/laminectomy at L4-L5.    ___________________________________________ Ihor Austin. Benjamin Stain, M.D., ABFM., CAQSM. Primary Care and Sports Medicine [facility]  Adjunct Instructor of Family Medicine  [employer]

## 2020-01-07 NOTE — Assessment & Plan Note (Signed)
And he is in a difficult situation, he has severe L4-L5 lumbar spinal stenosis with right-sided L5 distribution radiculitis. We initially tried conservative treatment which did not work, gabapentin was insufficiently efficacious. An L5-S1 transforaminal selective epidural also did not provide any relief. We are now going to proceed with an interlaminar epidural on the right at L4-L5, he is going to see chiropractic manipulation. Because he has significant amount of pain him also going to add some hydrocodone high-dose. If insufficient relief after the right L4-L5 epidural in a month of chiropractic manipulation I do think he will become a candidate for a lumbar decompression/laminectomy at L4-L5.

## 2020-01-22 ENCOUNTER — Other Ambulatory Visit: Payer: Self-pay

## 2020-01-22 ENCOUNTER — Ambulatory Visit
Admission: RE | Admit: 2020-01-22 | Discharge: 2020-01-22 | Disposition: A | Discharge status: Home / Self Care | Payer: PRIVATE HEALTH INSURANCE | Source: Ambulatory Visit | Attending: Sports Medicine | Admitting: Sports Medicine

## 2020-01-22 DIAGNOSIS — M48061 Spinal stenosis, lumbar region without neurogenic claudication: Secondary | ICD-10-CM

## 2020-01-22 MED ORDER — IOPAMIDOL (ISOVUE-M 200) INJECTION 41%
1.0000 mL | Freq: Once | INTRAMUSCULAR | Status: AC
  Administered 2020-01-22: 11:00:00 1 mL via EPIDURAL

## 2020-01-22 MED ORDER — METHYLPREDNISOLONE ACETATE 40 MG/ML INJ SUSP (RADIOLOG
120.0000 mg | Freq: Once | INTRAMUSCULAR | Status: AC
  Administered 2020-01-22: 120 mg via EPIDURAL

## 2020-04-22 ENCOUNTER — Other Ambulatory Visit: Payer: Self-pay | Admitting: Osteopathic Medicine

## 2020-04-22 DIAGNOSIS — I10 Essential (primary) hypertension: Secondary | ICD-10-CM

## 2020-05-18 ENCOUNTER — Encounter: Payer: Self-pay | Admitting: Osteopathic Medicine

## 2020-05-18 ENCOUNTER — Other Ambulatory Visit: Payer: Self-pay

## 2020-05-18 ENCOUNTER — Ambulatory Visit (INDEPENDENT_AMBULATORY_CARE_PROVIDER_SITE_OTHER): Payer: PRIVATE HEALTH INSURANCE | Admitting: Osteopathic Medicine

## 2020-05-18 VITALS — BP 125/90 | HR 90 | Temp 98.0°F | Wt 217.0 lb

## 2020-05-18 DIAGNOSIS — I10 Essential (primary) hypertension: Secondary | ICD-10-CM

## 2020-05-18 DIAGNOSIS — E119 Type 2 diabetes mellitus without complications: Secondary | ICD-10-CM | POA: Diagnosis not present

## 2020-05-18 DIAGNOSIS — E785 Hyperlipidemia, unspecified: Secondary | ICD-10-CM | POA: Diagnosis not present

## 2020-05-18 DIAGNOSIS — E1169 Type 2 diabetes mellitus with other specified complication: Secondary | ICD-10-CM

## 2020-05-18 LAB — POCT GLYCOSYLATED HEMOGLOBIN (HGB A1C): Hemoglobin A1C: 7.5 % — AB (ref 4.0–5.6)

## 2020-05-18 NOTE — Progress Notes (Signed)
Jeffrey Nichols is a 59 y.o. male who presents to  [facility] at [facility]  today, 05/18/20, seeking care for the following: Marland Kitchen Medication monitoring: HTN, HLD, DM2      ASSESSMENT & PLAN with other pertinent history/findings:  The primary encounter diagnosis was Essential hypertension. Diagnoses of Type 2 diabetes mellitus without complication, without long-term current use of insulin (HCC) and Hyperlipidemia associated with type 2 diabetes mellitus (HCC) were also pertinent to this visit.   1. Essential hypertension BP Readings from Last 3 Encounters:  05/18/20 125/90  01/22/20 (!) 165/83  11/23/19 (!) 192/87  Diastolic a bit above goal but systolic ok   2. Type 2 diabetes mellitus without complication, without long-term current use of insulin (HCC) Last A1C 7.0 07/2019 Results for orders placed or performed in visit on 05/18/20 (from the past 24 hour(s))  POCT HgB A1C     Status: Abnormal   Collection Time: 05/18/20  8:36 AM  Result Value Ref Range   Hemoglobin A1C 7.5 (A) 4.0 - 5.6 %   HbA1c POC (<> result, manual entry)     HbA1c, POC (prediabetic range)     HbA1c, POC (controlled diabetic range)     Wt Readings from Last 3 Encounters:  05/18/20 217 lb 0.6 oz (98.4 kg)  11/16/19 222 lb (100.7 kg)  10/02/19 220 lb (99.8 kg)   3. Hyperlipidemia associated with type 2 diabetes mellitus (HCC) Last lipids 07/2019 were ok   4. Colon polyp  Due for colonoscopy Pt to call [facility]   5. Need COVID vaccine  We spent some time going over this - I encouraged vaccination     Follow-up instructions: Return in about 6 months (around 11/17/2020) for [place] (call week prior to visit for lab orders). Will need CBC, CMP, PSA, A1C                                          There were no vitals taken for this visit.  No outpatient medications have been marked as taking for the 05/18/20 encounter  (Appointment) with Sunnie Nielsen, DO.    No results found for this or any previous visit (from the past 72 hour(s)).  No results found.  Depression screen [facility] 2/9 09/30/2019 11/20/2018 05/22/2018  Decreased Interest 0 0 0  Down, Depressed, Hopeless 0 0 0  PHQ - 2 Score 0 0 0  Altered sleeping - 2 3  Tired, decreased energy - 2 1  Change in appetite - 0 0  Feeling bad or failure about yourself  - 0 0  Trouble concentrating - 0 0  Moving slowly or fidgety/restless - 0 0  Suicidal thoughts - 0 0  PHQ-9 Score - 4 4  Difficult doing work/chores - Somewhat difficult Somewhat difficult    GAD 7 : Generalized Anxiety Score 09/30/2019 11/20/2018 05/22/2018  Nervous, Anxious, on Edge 1 2 0  Control/stop worrying 1 2 0  Worry too much - different things 1 2 0  Trouble relaxing 1 2 3   Restless 1 1 1   Easily annoyed or irritable 1 1 2   Afraid - awful might happen 0 1 0  Total GAD 7 Score 6 11 6   Anxiety Difficulty Very difficult Somewhat difficult Somewhat difficult      All questions at time of visit were answered - patient instructed to contact office  with any additional concerns or updates.  ER/RTC precautions were reviewed with the patient.  Please note: voice recognition software was used to produce this document, and typos may escape review. Please contact Dr. Sheppard Coil for any needed clarifications.

## 2020-05-18 NOTE — Patient Instructions (Addendum)
   A1C above goal - work on diet/exercise, if not better we might need to add/adjust Rx   Please call Dr Orion Modest office to schedule follow-up colonoscopy [phone]

## 2020-06-07 ENCOUNTER — Other Ambulatory Visit: Payer: Self-pay | Admitting: Osteopathic Medicine

## 2020-06-07 DIAGNOSIS — I10 Essential (primary) hypertension: Secondary | ICD-10-CM

## 2020-06-08 ENCOUNTER — Other Ambulatory Visit: Payer: Self-pay

## 2020-06-08 DIAGNOSIS — E119 Type 2 diabetes mellitus without complications: Secondary | ICD-10-CM

## 2020-06-08 DIAGNOSIS — E1169 Type 2 diabetes mellitus with other specified complication: Secondary | ICD-10-CM

## 2020-06-08 DIAGNOSIS — E785 Hyperlipidemia, unspecified: Secondary | ICD-10-CM

## 2020-06-08 DIAGNOSIS — M5416 Radiculopathy, lumbar region: Secondary | ICD-10-CM

## 2020-06-08 DIAGNOSIS — I10 Essential (primary) hypertension: Secondary | ICD-10-CM

## 2020-06-08 MED ORDER — ATORVASTATIN CALCIUM 40 MG PO TABS: 40.0000 mg | ORAL_TABLET | Freq: Every day | ORAL | 3 refills | Status: DC

## 2020-06-08 MED ORDER — HYDROCHLOROTHIAZIDE 12.5 MG PO TABS: 12.5000 mg | ORAL_TABLET | Freq: Every day | ORAL | 1 refills | Status: DC

## 2020-06-08 MED ORDER — GLIPIZIDE 5 MG PO TABS: 5.0000 mg | ORAL_TABLET | Freq: Two times a day (BID) | ORAL | 1 refills | Status: DC

## 2020-06-08 MED ORDER — METFORMIN HCL 1000 MG PO TABS: 1000.0000 mg | ORAL_TABLET | Freq: Two times a day (BID) | ORAL | 1 refills | Status: DC

## 2020-06-08 MED ORDER — GABAPENTIN 300 MG PO CAPS: ORAL_CAPSULE | ORAL | 3 refills | Status: DC

## 2020-06-08 MED ORDER — ENALAPRIL MALEATE 20 MG PO TABS: 40.0000 mg | ORAL_TABLET | Freq: Every day | ORAL | 0 refills | Status: DC

## 2020-08-03 ENCOUNTER — Other Ambulatory Visit: Payer: Self-pay | Admitting: Osteopathic Medicine

## 2020-08-03 DIAGNOSIS — I10 Essential (primary) hypertension: Secondary | ICD-10-CM

## 2020-08-05 ENCOUNTER — Other Ambulatory Visit: Payer: Self-pay

## 2020-08-05 DIAGNOSIS — I10 Essential (primary) hypertension: Secondary | ICD-10-CM

## 2020-08-05 MED ORDER — ENALAPRIL MALEATE 20 MG PO TABS: 40.0000 mg | ORAL_TABLET | Freq: Every day | ORAL | 0 refills | Status: DC

## 2020-08-24 ENCOUNTER — Other Ambulatory Visit: Payer: Self-pay | Admitting: Osteopathic Medicine

## 2020-08-24 DIAGNOSIS — E119 Type 2 diabetes mellitus without complications: Secondary | ICD-10-CM

## 2020-08-24 DIAGNOSIS — I10 Essential (primary) hypertension: Secondary | ICD-10-CM

## 2020-11-07 ENCOUNTER — Other Ambulatory Visit: Payer: Self-pay | Admitting: Osteopathic Medicine

## 2020-11-07 DIAGNOSIS — I10 Essential (primary) hypertension: Secondary | ICD-10-CM

## 2020-11-23 ENCOUNTER — Ambulatory Visit (INDEPENDENT_AMBULATORY_CARE_PROVIDER_SITE_OTHER): Payer: PRIVATE HEALTH INSURANCE | Admitting: Osteopathic Medicine

## 2020-11-23 ENCOUNTER — Other Ambulatory Visit: Payer: Self-pay

## 2020-11-23 ENCOUNTER — Encounter: Payer: Self-pay | Admitting: Osteopathic Medicine

## 2020-11-23 VITALS — BP 131/77 | HR 80 | Temp 98.4°F | Wt 223.1 lb

## 2020-11-23 DIAGNOSIS — M5416 Radiculopathy, lumbar region: Secondary | ICD-10-CM

## 2020-11-23 DIAGNOSIS — I10 Essential (primary) hypertension: Secondary | ICD-10-CM | POA: Diagnosis not present

## 2020-11-23 DIAGNOSIS — E1169 Type 2 diabetes mellitus with other specified complication: Secondary | ICD-10-CM

## 2020-11-23 DIAGNOSIS — E785 Hyperlipidemia, unspecified: Secondary | ICD-10-CM

## 2020-11-23 DIAGNOSIS — E119 Type 2 diabetes mellitus without complications: Secondary | ICD-10-CM

## 2020-11-23 LAB — POCT GLYCOSYLATED HEMOGLOBIN (HGB A1C): Hemoglobin A1C: 7.6 % — AB (ref 4.0–5.6)

## 2020-11-23 MED ORDER — ALBUTEROL SULFATE HFA 108 (90 BASE) MCG/ACT IN AERS: 1.0000 | INHALATION_SPRAY | RESPIRATORY_TRACT | 5 refills | Status: DC | PRN

## 2020-11-23 MED ORDER — HYDROCHLOROTHIAZIDE 12.5 MG PO TABS: 12.5000 mg | ORAL_TABLET | Freq: Every day | ORAL | 3 refills | Status: DC

## 2020-11-23 MED ORDER — METFORMIN HCL 1000 MG PO TABS: 1000.0000 mg | ORAL_TABLET | Freq: Two times a day (BID) | ORAL | 3 refills | Status: DC

## 2020-11-23 MED ORDER — ATORVASTATIN CALCIUM 40 MG PO TABS: 40.0000 mg | ORAL_TABLET | Freq: Every day | ORAL | 3 refills | Status: DC

## 2020-11-23 MED ORDER — GLIPIZIDE 5 MG PO TABS: 5.0000 mg | ORAL_TABLET | Freq: Two times a day (BID) | ORAL | 3 refills | Status: DC

## 2020-11-23 MED ORDER — ENALAPRIL MALEATE 20 MG PO TABS: 40.0000 mg | ORAL_TABLET | Freq: Every day | ORAL | 3 refills | Status: DC

## 2020-11-23 NOTE — Progress Notes (Signed)
Jeffrey Nichols is a 59 y.o. male who presents to  [facility] at [facility]  today, 11/23/20, seeking care for the following:  . Follow up diabetes - A1C stable at 7.6 today, discussed goal <7.0 . Follow up HTN . Needs routine labs    BP Readings from Last 3 Encounters:  11/23/20 131/77  05/18/20 125/90  01/22/20 (!) 165/83   Wt Readings from Last 3 Encounters:  11/23/20 223 lb 1.6 oz (101.2 kg)  05/18/20 217 lb 0.6 oz (98.4 kg)  11/16/19 222 lb (100.7 kg)      ASSESSMENT & PLAN with other pertinent findings:  The primary encounter diagnosis was Type 2 diabetes mellitus without complication, without long-term current use of insulin (HCC). Diagnoses of Hyperlipidemia associated with type 2 diabetes mellitus (HCC), Essential hypertension, Right lumbar radiculitis, and Type 2 diabetes mellitus without complication (HCC) were also pertinent to this visit.   Results for orders placed or performed in visit on 11/23/20 (from the past 24 hour(s))  POCT glycosylated hemoglobin (Hb A1C)     Status: Abnormal   Collection Time: 11/23/20  8:31 AM  Result Value Ref Range   Hemoglobin A1C 7.6 (A) 4.0 - 5.6 %   HbA1c POC (<> result, manual entry)     HbA1c, POC (prediabetic range)     HbA1c, POC (controlled diabetic range)         Patient Instructions  COVID vaccine is strongly recommended. I cannot stress enough the importance of getting vaccinated!   Make appt w/ Dr T for hip/back pain    Orders Placed This Encounter  Procedures  . CBC with Differential/Platelet  . COMPLETE METABOLIC PANEL WITH GFR  . Lipid panel  . PSA, Total with Reflex to PSA, Free  . POCT glycosylated hemoglobin (Hb A1C)    Meds ordered this encounter  Medications  . atorvastatin (LIPITOR) 40 MG tablet    Sig: Take 1 tablet (40 mg total) by mouth daily.    Dispense:  90 tablet    Refill:  3  . enalapril (VASOTEC) 20 MG tablet    Sig: Take 2 tablets (40 mg  total) by mouth daily.    Dispense:  180 tablet    Refill:  3  . glipiZIDE (GLUCOTROL) 5 MG tablet    Sig: Take 1 tablet (5 mg total) by mouth 2 (two) times daily.    Dispense:  180 tablet    Refill:  3  . hydrochlorothiazide (HYDRODIURIL) 12.5 MG tablet    Sig: Take 1 tablet (12.5 mg total) by mouth daily.    Dispense:  90 tablet    Refill:  3  . metFORMIN (GLUCOPHAGE) 1000 MG tablet    Sig: Take 1 tablet (1,000 mg total) by mouth 2 (two) times daily with a meal.    Dispense:  180 tablet    Refill:  3  . albuterol (VENTOLIN HFA) 108 (90 Base) MCG/ACT inhaler    Sig: Inhale 1-2 puffs into the lungs every 4 (four) hours as needed for wheezing or shortness of breath.    Dispense:  2 each    Refill:  5       Follow-up instructions: Return in about 4 months (around 03/24/2021) for monitor A1C, SEE [place] SOONER IF NEEDED. CALL/MESSAGE W/ QUESTIONS.  BP 131/77   Pulse 80   Temp 98.4 F (36.9 C)   Wt 223 lb 1.6 oz (101.2 kg)   SpO2 96%   BMI 32.01 kg/m  Constitutional:  . VSS, see nurse notes . General Appearance: alert, well-developed, well-nourished, NAD Eyes: Marland Kitchen Normal lids and conjunctive, non-icteric sclera Neck: . No masses, trachea midline . No thyroid enlargement/tenderness/mass appreciated Respiratory: . Normal respiratory effort . No dullness/hyper-resonance to percussion . Breath sounds normal, no wheeze/rhonchi/rales Cardiovascular: . S1/S2 normal, no murmur/rub/gallop auscultated . No lower extremity edema Musculoskeletal:  . Gait normal Neurological: . No cranial nerve deficit on limited exam . Motor and sensation intact and symmetric Psychiatric: . Normal judgment/insight . Normal mood and affect  Current Meds  Medication Sig  . albuterol (VENTOLIN HFA) 108 (90 Base) MCG/ACT inhaler Inhale 1-2 puffs into the lungs every 4 (four) hours as needed for wheezing or shortness of  breath.  . gabapentin (NEURONTIN) 300 MG capsule One tab PO qHS for a week, then BID for a week, then TID. May double weekly to a max of 3,600mg /day  . [DISCONTINUED] atorvastatin (LIPITOR) 40 MG tablet Take 1 tablet (40 mg total) by mouth daily.  . [DISCONTINUED] enalapril (VASOTEC) 20 MG tablet Take 2 tablets (40 mg total) by mouth daily. Needs labs and appointment.  . [DISCONTINUED] glipiZIDE (GLUCOTROL) 5 MG tablet Take 1 tablet by mouth twice daily  . [DISCONTINUED] hydrochlorothiazide (HYDRODIURIL) 12.5 MG tablet Take 1 tablet by mouth once daily  . [DISCONTINUED] metFORMIN (GLUCOPHAGE) 1000 MG tablet Take 1 tablet (1,000 mg total) by mouth 2 (two) times daily with a meal.    Results for orders placed or performed in visit on 11/23/20 (from the past 72 hour(s))  POCT glycosylated hemoglobin (Hb A1C)     Status: Abnormal   Collection Time: 11/23/20  8:31 AM  Result Value Ref Range   Hemoglobin A1C 7.6 (A) 4.0 - 5.6 %   HbA1c POC (<> result, manual entry)     HbA1c, POC (prediabetic range)     HbA1c, POC (controlled diabetic range)      No results found.     All questions at time of visit were answered - patient instructed to contact office with any additional concerns or updates.  ER/RTC precautions were reviewed with the patient as applicable.   Please note: voice recognition software was used to produce this document, and typos may escape review. Please contact Dr. Lyn Hollingshead for any needed clarifications.

## 2020-11-23 NOTE — Patient Instructions (Addendum)
COVID vaccine is strongly recommended. I cannot stress enough the importance of getting vaccinated!   Make appt w/ Dr T for hip/back pain

## 2020-11-24 LAB — COMPLETE METABOLIC PANEL WITH GFR
AG Ratio: 1.4 (calc) (ref 1.0–2.5)
ALT: 37 U/L (ref 9–46)
AST: 37 U/L — ABNORMAL HIGH (ref 10–35)
Albumin: 3.8 g/dL (ref 3.6–5.1)
Alkaline phosphatase (APISO): 73 U/L (ref 35–144)
BUN/Creatinine Ratio: 21 (calc) (ref 6–22)
BUN: 14 mg/dL (ref 7–25)
CO2: 23 mmol/L (ref 20–32)
Calcium: 9.2 mg/dL (ref 8.6–10.3)
Chloride: 107 mmol/L (ref 98–110)
Creat: 0.67 mg/dL — ABNORMAL LOW (ref 0.70–1.33)
GFR, Est African American: 123 mL/min/{1.73_m2} (ref >=60)
GFR, Est Non African American: 106 mL/min/{1.73_m2} (ref >=60)
Globulin: 2.8 g/dL (calc) (ref 1.9–3.7)
Glucose, Bld: 130 mg/dL — ABNORMAL HIGH (ref 65–99)
Potassium: 4 mmol/L (ref 3.5–5.3)
Sodium: 138 mmol/L (ref 135–146)
Total Bilirubin: 1.1 mg/dL (ref 0.2–1.2)
Total Protein: 6.6 g/dL (ref 6.1–8.1)

## 2020-11-24 LAB — CBC WITH DIFFERENTIAL/PLATELET
Absolute Monocytes: 272 cells/uL (ref 200–950)
Basophils Absolute: 61 cells/uL (ref 0–200)
Basophils Relative: 1.8 %
Eosinophils Absolute: 201 cells/uL (ref 15–500)
Eosinophils Relative: 5.9 %
HCT: 38.9 % (ref 38.5–50.0)
Hemoglobin: 13.7 g/dL (ref 13.2–17.1)
Lymphs Abs: 785 cells/uL — ABNORMAL LOW (ref 850–3900)
MCH: 31.6 pg (ref 27.0–33.0)
MCHC: 35.2 g/dL (ref 32.0–36.0)
MCV: 89.6 fL (ref 80.0–100.0)
MPV: 10.8 fL (ref 7.5–12.5)
Monocytes Relative: 8 %
Neutro Abs: 2081 cells/uL (ref 1500–7800)
Neutrophils Relative %: 61.2 %
Platelets: 77 10*3/uL — ABNORMAL LOW (ref 140–400)
RBC: 4.34 10*6/uL (ref 4.20–5.80)
RDW: 13.8 % (ref 11.0–15.0)
Total Lymphocyte: 23.1 %
WBC: 3.4 10*3/uL — ABNORMAL LOW (ref 3.8–10.8)

## 2020-11-24 LAB — LIPID PANEL
Cholesterol: 93 mg/dL (ref <200)
HDL: 31 mg/dL — ABNORMAL LOW (ref >=40)
LDL Cholesterol (Calc): 44 mg/dL (calc)
Non-HDL Cholesterol (Calc): 62 mg/dL (calc) (ref <130)
Total CHOL/HDL Ratio: 3 (calc) (ref <5.0)
Triglycerides: 93 mg/dL (ref <150)

## 2020-11-24 LAB — PSA, TOTAL WITH REFLEX TO PSA, FREE: PSA, Total: 0.4 ng/mL (ref <=4.0)

## 2021-01-16 ENCOUNTER — Telehealth: Payer: Self-pay

## 2021-01-16 NOTE — Telephone Encounter (Signed)
Nadia called and complains about left sided sharp chest pain since Saturday. Advised him to go to the ED for evaluation.

## 2021-02-06 ENCOUNTER — Telehealth (INDEPENDENT_AMBULATORY_CARE_PROVIDER_SITE_OTHER): Payer: PRIVATE HEALTH INSURANCE | Admitting: Family Medicine

## 2021-02-06 ENCOUNTER — Encounter: Payer: Self-pay | Admitting: Family Medicine

## 2021-02-06 DIAGNOSIS — J22 Unspecified acute lower respiratory infection: Secondary | ICD-10-CM

## 2021-02-06 DIAGNOSIS — J0100 Acute maxillary sinusitis, unspecified: Secondary | ICD-10-CM

## 2021-02-06 MED ORDER — FLOVENT HFA 44 MCG/ACT IN AERO: 2.0000 | INHALATION_SPRAY | Freq: Two times a day (BID) | RESPIRATORY_TRACT | 12 refills | Status: AC

## 2021-02-06 MED ORDER — AMOXICILLIN-POT CLAVULANATE 875-125 MG PO TABS: 1.0000 | ORAL_TABLET | Freq: Two times a day (BID) | ORAL | 0 refills | Status: AC

## 2021-02-06 NOTE — Progress Notes (Signed)
Virtual Video Visit via MyChart Note  I connected with  Dorina Hoyer on 02/06/21 at  3:00 PM EDT by the video enabled telemedicine application for MyChart, and verified that I am speaking with the correct person using two identifiers.   I introduced myself as a Publishing rights manager with the practice. We discussed the limitations of evaluation and management by telemedicine and the availability of in person appointments. The patient expressed understanding and agreed to proceed.  Participating parties in this visit include: The patient and the nurse practitioner listed.  The patient is: At home I am: In the office - [facility]  Subjective:    CC:  Chief Complaint  Patient presents with  . Sinusitis    HPI: Jeffrey Nichols is a 60 y.o. year old male presenting today via MyChart today for sinusitis.  Patient with about 8-9 days of sinus congestion, headache, body aches, trouble sleeping, mild temperature elevation (99 F), congested cough, fatigue, malaise. He reports he started feeling a little bit better towards the end of last week, but things started to worsen again over the weekend. He is reporting 7/10 sinus pressure, nasal congestion with yellow mucus. He has been taking ibuprofen and Mucinex that did seem to be helping earlier in the course of infection. He tried some Robitussin DM but felt like it was raising is blood sugars and blood pressures so he stopped taking it. He reports his symptoms are worse at night and he has been noticing some wheezing lately causing him to use his previously prescribed albuterol inhaler more frequently the past few days. He denies any chest pain, pain with inspiration, dyspnea at rest, nausea, vomiting, diarrhea, loss of taste/smell. He had a negative rapid COVID test on Friday.    Past medical history, Surgical history, Family history not pertinant except as noted below, Social history, Allergies, and medications have been entered into the medical  record, reviewed, and corrections made.   Review of Systems:  All review of systems negative except what is listed in the HPI   Objective:    General:  Speaking clearly in complete sentences. Absent shortness of breath noted.   Alert and oriented x3.   Normal judgment.  Absent acute distress.   Impression and Recommendations:    1. Acute non-recurrent maxillary sinusitis Double sickening and duration of illness consistent with sinusitis. Will go ahead and treat with Augmentin. Recommend he continue other conservative measures including Flonase, humidifier use, warm compresses, rest, hydration.   - amoxicillin-clavulanate (AUGMENTIN) 875-125 MG tablet; Take 1 tablet by mouth 2 (two) times daily for 5 days.  Dispense: 10 tablet; Refill: 0  2. Lower respiratory infection Patient reports his asthma seems to be flaring up a little bit as noted by increased wheezing and dyspnea with exertion. Would be concerned about risk for secondary infection developing in the lungs - Augmentin should cover for this also. Will also add Flovent inhaler. Patient encouraged to try Coricidin for bedtime cough maintenance as this should be safe for his blood pressure. Continue Mucinex, hydration, rest.  - amoxicillin-clavulanate (AUGMENTIN) 875-125 MG tablet; Take 1 tablet by mouth 2 (two) times daily for 5 days.  Dispense: 10 tablet; Refill: 0 - fluticasone (FLOVENT HFA) 44 MCG/ACT inhaler; Inhale 2 puffs into the lungs in the morning and at bedtime.  Dispense: 1 each; Refill: 12   Patient educated on signs and symptoms that would require urgent evaluation.  Follow-up if symptoms worsen or fail to improve.    I discussed  the assessment and treatment plan with the patient. The patient was provided an opportunity to ask questions and all were answered. The patient agreed with the plan and demonstrated an understanding of the instructions.   The patient was advised to call back or seek an in-person  evaluation if the symptoms worsen or if the condition fails to improve as anticipated.  I provided 20 minutes of non-face-to-face interaction with this MYCHART visit including intake, same-day documentation, and chart review.   Clayborne Dana, NP

## 2021-02-06 NOTE — Patient Instructions (Signed)
Treating you with an antibiotic for your sinus infection and adding another inhaler for your wheezing/asthma flare. Please continue: Mucinex, Tylenol, humidifier, warm compresses to your sinuses, rest, hydration. You can try Coricidin for cough - this is safe for high blood pressure.  Let us know if you are not noticing some improvement over the next several days.  I hope you feel better soon!

## 2021-03-23 ENCOUNTER — Ambulatory Visit (INDEPENDENT_AMBULATORY_CARE_PROVIDER_SITE_OTHER): Payer: PRIVATE HEALTH INSURANCE | Admitting: Osteopathic Medicine

## 2021-03-23 ENCOUNTER — Encounter: Payer: Self-pay | Admitting: Osteopathic Medicine

## 2021-03-23 ENCOUNTER — Other Ambulatory Visit: Payer: Self-pay

## 2021-03-23 VITALS — BP 140/62 | HR 64 | Temp 98.0°F | Wt 222.0 lb

## 2021-03-23 DIAGNOSIS — R9389 Abnormal findings on diagnostic imaging of other specified body structures: Secondary | ICD-10-CM

## 2021-03-23 DIAGNOSIS — F17200 Nicotine dependence, unspecified, uncomplicated: Secondary | ICD-10-CM

## 2021-03-23 DIAGNOSIS — E119 Type 2 diabetes mellitus without complications: Secondary | ICD-10-CM

## 2021-03-23 DIAGNOSIS — R918 Other nonspecific abnormal finding of lung field: Secondary | ICD-10-CM

## 2021-03-23 DIAGNOSIS — Z86010 Personal history of colonic polyps: Secondary | ICD-10-CM

## 2021-03-23 LAB — POCT GLYCOSYLATED HEMOGLOBIN (HGB A1C): Hemoglobin A1C: 7.8 % — AB (ref 4.0–5.6)

## 2021-03-23 NOTE — Progress Notes (Signed)
Jeffrey Nichols is a 60 y.o. male who presents to  [facility] at [facility]  today, 03/23/21, seeking care for the following:  DM2 FOLLOW-UP   Current meds: metformin 1000 mg bid, glipizide 5 mg bid  On ACE, statin LDL at goal 44 as of 10/2920 BP bit above goal   03/23/21: 7.8 --> pt reports room for improvement on diet/exercise  11/23/20: 7.6 --> pt opted to work on lifestyle measures, keep Rx same 05/18/20: 7.5    ER FOLLOW-UP 01/16/21 CHEST PAIN  L sided CP, cramping, increased cough  EKG ok, Troponin (-)x2. CXR 1. No acute cardiopulmonary processes are identified. 2. Calcific like density of the right apex appears to be related to possible fracture of the posterior right second rib. Other dystrophic soft tissue calcification possibly related to the thyroid or lymph nodes would also be considered.  Referred to cardiology but has not made contact w/ them Reports normal stress test years ago Reports intermittent chest pain, no consistent CP on exertion    NEW PROBLEM  UPPER ABDOMINAL PAIN  Ongoing few months, feels like cramping w/ nausea, no timing / triggers identified. S/p cholecystectomy, No GERD. No early satiety. On exam palpable epigastric mass nonreducible but close to abdominal incision from previous surgery. Mild TTP RUQ     ASSESSMENT & PLAN with other pertinent findings:  The primary encounter diagnosis was Type 2 diabetes mellitus without complication, without long-term current use of insulin (HCC). Diagnoses of Tobacco dependence, Abnormal chest x-ray, Abnormal findings on diagnostic imaging of lung, and History of colon polyps were also pertinent to this visit.    Patient Instructions  Diabetes  A1C stable at 7.8, ideally would like to get this to goal <7.0  Work on lower carb diet, increase exercise as able  No need to adjust medications right now but if A1C oing higher than 8.0-8.5 we should strongly consider  adding/changing Rx  Vaccines for diabetics  Pneumonia vaccines: done! Recommend booster after age 40  COVID vaccine: STRONGLY RECOMMENDED  Chest pain / ER follow-up  Recommend follow up with cardiology - diabetes and smoking put you at higher than average risk for heart disease  Cancer screenings   Colon cancer screening: Colonoscopy due per Dr. Orion Modest instructions in 2018.   Lung cancer screening: CT chest every year for those aged 32 to 80 years who have a 20 pack-year smoking history and currently smoke or have quit within the past 15 years. I've ordered CT lungs based on abnormal chest Xray in ER  Abdominal pain  suspect hernia / scar tissue  Will get CT scan    Let's hold off on CT chest / abdomen until you are seen by cardiology - they may want to repeat stress test vs get CT of the heart - bunch of CT scans in your future! Hopefully only one trip to imaging!      Orders Placed This Encounter  Procedures  . CT Chest Wo Contrast  . Ambulatory referral to Gastroenterology  . POCT HgB A1C    No orders of the defined types were placed in this encounter.    See below for relevant physical exam findings  See below for recent lab and imaging results reviewed  Medications, allergies, PMH, PSH, SocH, FamH reviewed below    Follow-up instructions: Return in about 4 months (around 07/23/2021) for monitor A1C, SEE [place] SOONER IF NEEDED. CALL/MESSAGE W/ QUESTIONS.  Exam:  BP 140/62 (BP Location: Left Arm, Patient Position: Sitting, Cuff Size: Normal)   Pulse 64   Temp 98 F (36.7 C) (Oral)   Wt 222 lb 0.6 oz (100.7 kg)   BMI 31.86 kg/m   Constitutional: VS see above. General Appearance: alert, well-developed, well-nourished, NAD  Neck: No masses, trachea midline.   Respiratory: Normal respiratory effort. no wheeze, no rhonchi, no rales  Cardiovascular: S1/S2 normal, no murmur, no rub/gallop  auscultated. RRR.   Musculoskeletal: Gait normal. Symmetric and independent movement of all extremities  Abdominal: non-tender, non-distended, no appreciable organomegaly, neg Murphy's, BS WNLx4  Neurological: Normal balance/coordination. No tremor.  Skin: warm, dry, intact.   Psychiatric: Normal judgment/insight. Normal mood and affect. Oriented x3.   Current Meds  Medication Sig  . albuterol (VENTOLIN HFA) 108 (90 Base) MCG/ACT inhaler Inhale 1-2 puffs into the lungs every 4 (four) hours as needed for wheezing or shortness of breath.  Marland Kitchen atorvastatin (LIPITOR) 40 MG tablet Take 1 tablet (40 mg total) by mouth daily.  . enalapril (VASOTEC) 20 MG tablet Take 2 tablets (40 mg total) by mouth daily.  . fluticasone (FLOVENT HFA) 44 MCG/ACT inhaler Inhale 2 puffs into the lungs in the morning and at bedtime.  . gabapentin (NEURONTIN) 300 MG capsule One tab PO qHS for a week, then BID for a week, then TID. May double weekly to a max of 3,600mg /day  . glipiZIDE (GLUCOTROL) 5 MG tablet Take 1 tablet (5 mg total) by mouth 2 (two) times daily.  . hydrochlorothiazide (HYDRODIURIL) 12.5 MG tablet Take 1 tablet (12.5 mg total) by mouth daily.  . metFORMIN (GLUCOPHAGE) 1000 MG tablet Take 1 tablet (1,000 mg total) by mouth 2 (two) times daily with a meal.    Allergies  Allergen Reactions  . Trazodone And Nefazodone     As per pt, made him feel "funny".  . Meloxicam Other (See Comments)    headache     Patient Active Problem List   Diagnosis Date Noted  . Peroneal tendinitis, right 11/16/2019  . Lumbar spinal stenosis 10/02/2019  . Chronic pain of right ankle 10/02/2019  . Abdominal aortic atherosclerosis (HCC) 05/22/2018  . Chronic bilateral low back pain without sciatica 10/03/2017  . Essential hypertension 10/02/2017  . Colon polyps 04/17/2017  . Skin lesion of scalp 03/07/2016  . Abnormal testicular exam 03/07/2016  . Type 2 diabetes mellitus without complication (HCC) 03/07/2016  .  Thrombocytopenia (HCC) 03/07/2016  . Trigger finger, left 10/19/2015  . Trigger finger, right 10/19/2015    Family History  Problem Relation Age of Onset  . Bladder Cancer Father   . Skin cancer Father   . Diabetes Father   . Diabetes Mother     Social History   Tobacco Use  Smoking Status Current Every Day Smoker  . Packs/day: 1.00  . Years: 30.00  . Pack years: 30.00  . Types: Cigarettes  Smokeless Tobacco Never Used    Past Surgical History:  Procedure Laterality Date  . ABCESS DRAINAGE     Nipple  . CHOLECYSTECTOMY    . ROTATOR CUFF REPAIR Right   . TONSILLECTOMY      Immunization History  Administered Date(s) Administered  . Influenza-Unspecified 09/04/2018  . Pneumococcal Polysaccharide-23 04/30/2005  . Tdap 05/22/2018    Recent Results (from the past 2160 hour(s))  POCT HgB A1C     Status: Abnormal   Collection Time: 03/23/21  9:04 AM  Result Value Ref Range   Hemoglobin A1C 7.8 (  A) 4.0 - 5.6 %   HbA1c POC (<> result, manual entry)     HbA1c, POC (prediabetic range)     HbA1c, POC (controlled diabetic range)      No results found.     All questions at time of visit were answered - patient instructed to contact office with any additional concerns or updates. ER/RTC precautions were reviewed with the patient as applicable.   Please note: manual typing as well as voice recognition software may have been used to produce this document - typos may escape review. Please contact Dr. Lyn Hollingshead for any needed clarifications.

## 2021-03-23 NOTE — Patient Instructions (Addendum)
Diabetes  A1C stable at 7.8, ideally would like to get this to goal <7.0  Work on lower carb diet, increase exercise as able  No need to adjust medications right now but if A1C oing higher than 8.0-8.5 we should strongly consider adding/changing Rx  Vaccines for diabetics  Pneumonia vaccines: done! Recommend booster after age 59  COVID vaccine: STRONGLY RECOMMENDED  Chest pain / ER follow-up  Recommend follow up with cardiology - diabetes and smoking put you at higher than average risk for heart disease  Cancer screenings   Colon cancer screening: Colonoscopy due per Dr. Orion Modest instructions in 2018.   Lung cancer screening: CT chest every year for those aged 55 to 80 years who have a 20 pack-year smoking history and currently smoke or have quit within the past 15 years. I've ordered CT lungs based on abnormal chest Xray in ER  Abdominal pain  suspect hernia / scar tissue  Will get CT scan    Let's hold off on CT chest / abdomen until you are seen by cardiology - they may want to repeat stress test vs get CT of the heart - bunch of CT scans in your future! Hopefully only one trip to imaging!

## 2021-04-21 ENCOUNTER — Ambulatory Visit (INDEPENDENT_AMBULATORY_CARE_PROVIDER_SITE_OTHER): Payer: PRIVATE HEALTH INSURANCE

## 2021-04-21 ENCOUNTER — Other Ambulatory Visit: Payer: Self-pay

## 2021-04-21 DIAGNOSIS — E119 Type 2 diabetes mellitus without complications: Secondary | ICD-10-CM | POA: Diagnosis not present

## 2021-04-21 DIAGNOSIS — I10 Essential (primary) hypertension: Secondary | ICD-10-CM

## 2021-04-21 DIAGNOSIS — R918 Other nonspecific abnormal finding of lung field: Secondary | ICD-10-CM

## 2021-04-21 DIAGNOSIS — F17200 Nicotine dependence, unspecified, uncomplicated: Secondary | ICD-10-CM

## 2021-04-27 ENCOUNTER — Other Ambulatory Visit: Payer: Self-pay | Admitting: Osteopathic Medicine

## 2021-04-27 DIAGNOSIS — K7460 Unspecified cirrhosis of liver: Secondary | ICD-10-CM

## 2021-05-01 ENCOUNTER — Telehealth: Payer: Self-pay

## 2021-05-01 NOTE — Telephone Encounter (Signed)
Patient called requesting a change for his GI referral. Patient does not want to go to [facility]. Can coordinator please change referral for patient to see Dr. Concepcion Elk. Thanks in advance.

## 2021-05-01 NOTE — Telephone Encounter (Signed)
Sent referral to Dr. Caryl Never at [facility] as patient requested. - CF

## 2021-06-01 ENCOUNTER — Other Ambulatory Visit: Payer: Self-pay | Admitting: Neurology

## 2021-06-01 DIAGNOSIS — M5416 Radiculopathy, lumbar region: Secondary | ICD-10-CM

## 2021-06-01 MED ORDER — GABAPENTIN 300 MG PO CAPS: ORAL_CAPSULE | ORAL | 3 refills | Status: AC

## 2021-06-20 IMAGING — CT CT CHEST W/O CM
2 of 4 series · 15 of 36 positions shown, 18 images · non-contrast
Comparison: None; specifically, no prior exams demonstrating the
nodular density are available

CLINICAL DATA: Abnormal radiograph, lung nodule less than 1 cm,
follow-up; smoker, type II diabetes mellitus, hypertension

EXAM:
CT CHEST WITHOUT CONTRAST
TECHNIQUE: Multidetector CT imaging of the chest was performed following the
standard protocol without IV contrast. Sagittal and coronal MPR
images reconstructed from axial data set.

[Series 2: thorax · axial · 0.89mm/px · z∈[+982,+1272]mm · 12 of 173 slices shown, 15 images]
[im 14/173  mediastinal]
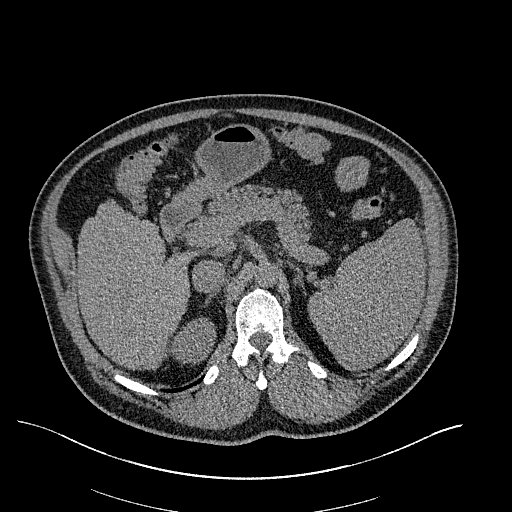
[im 14/173  lung]
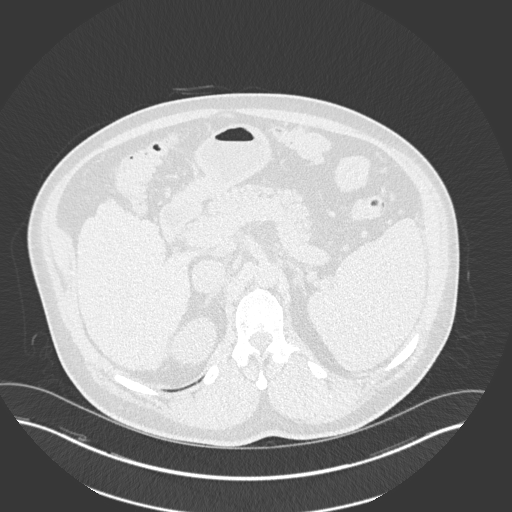
[im 27/173  lung]
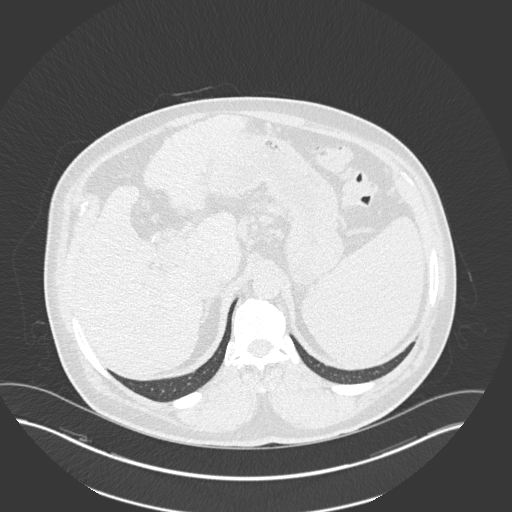
[im 40/173  lung]
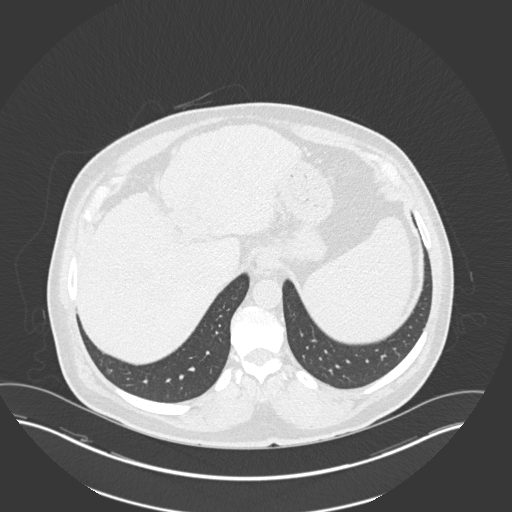
[im 53/173  lung]
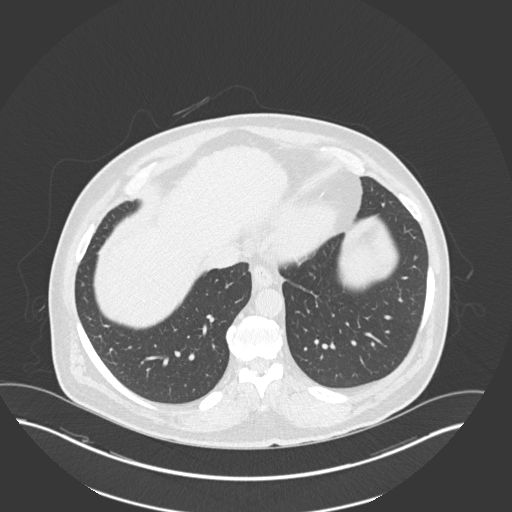
[im 67/173  mediastinal]
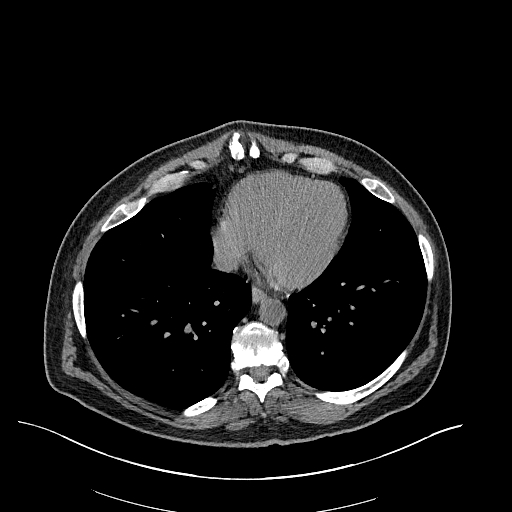
[im 67/173  lung]
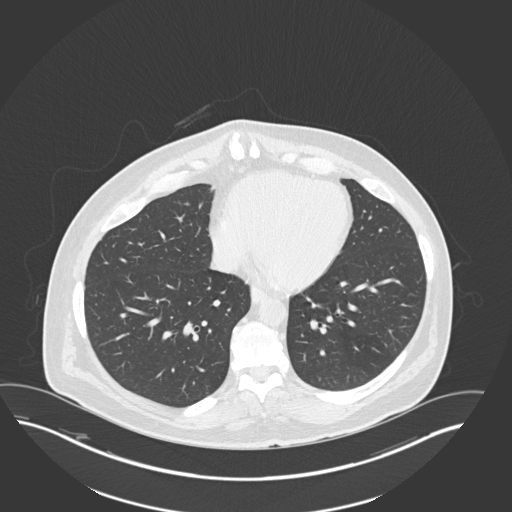
[im 80/173  lung]
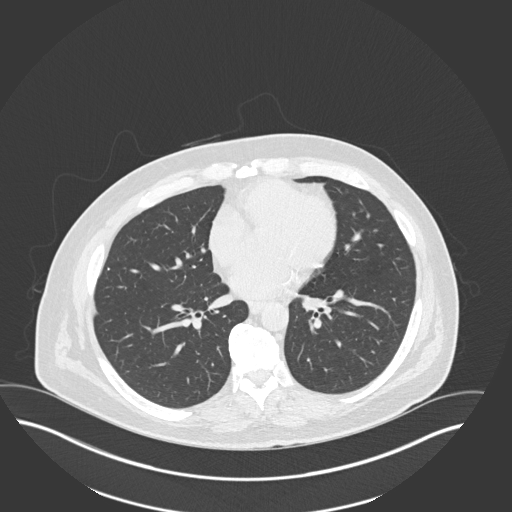
[im 93/173  lung]
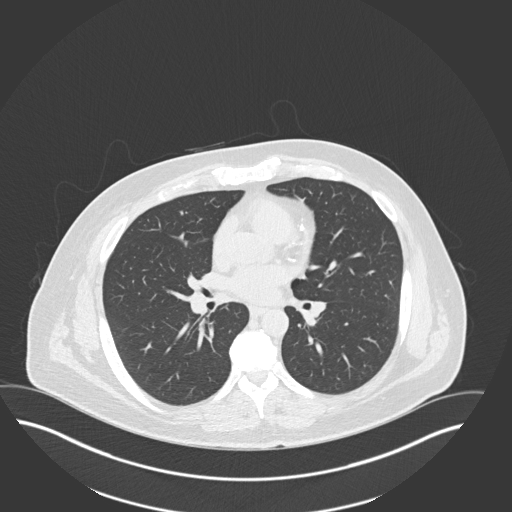
[im 106/173  lung]
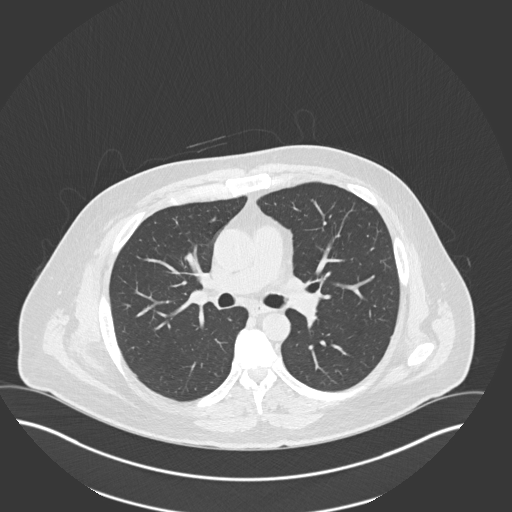
[im 120/173  mediastinal]
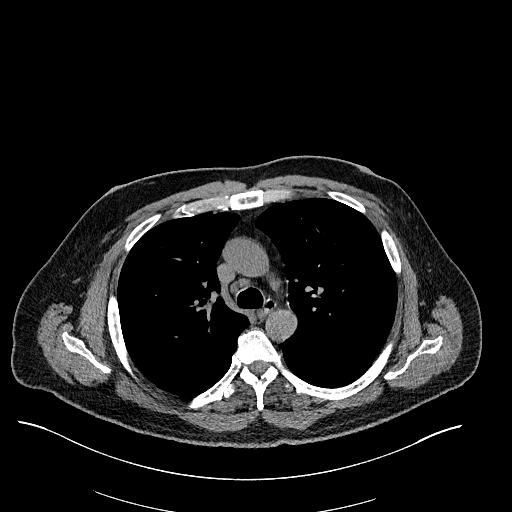
[im 120/173  lung]
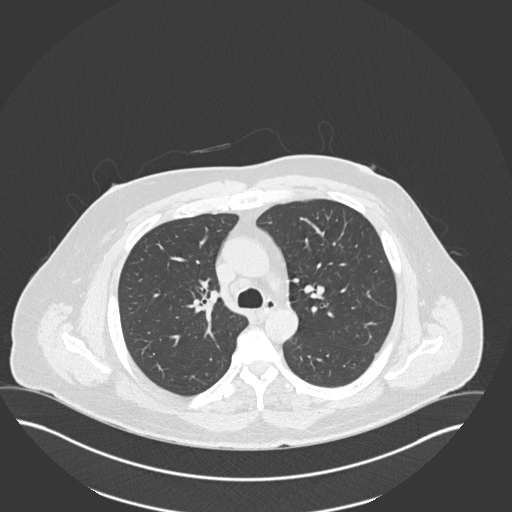
[im 133/173  lung]
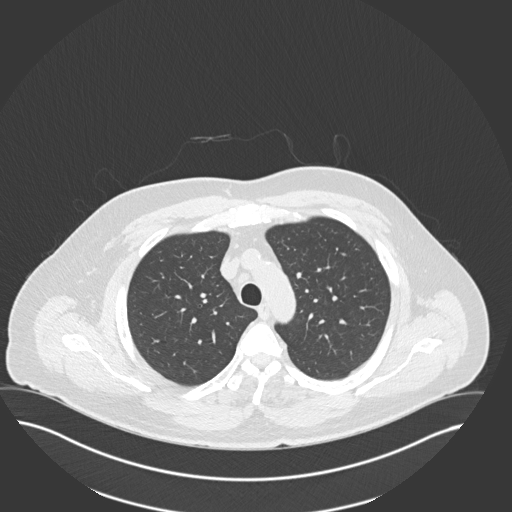
[im 146/173  lung]
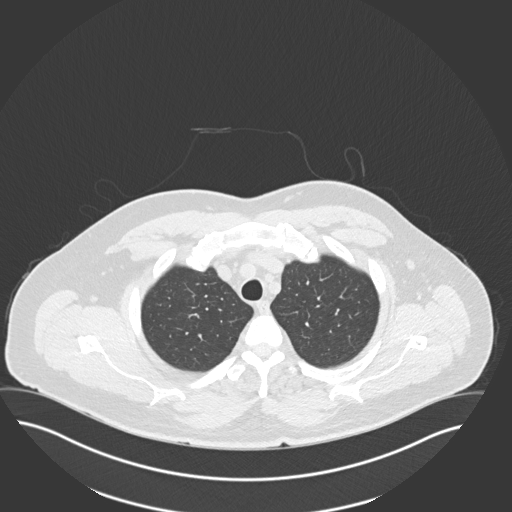
[im 159/173  lung]
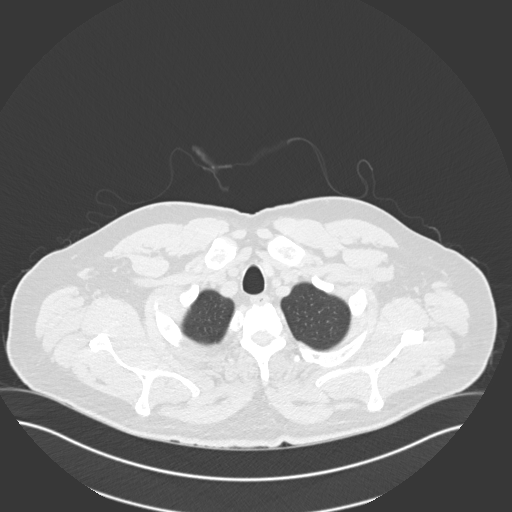

[Series 5: coronal · coronal · 0.66mm/px · 3 of 151 slices shown]
[im 31/151  lung]
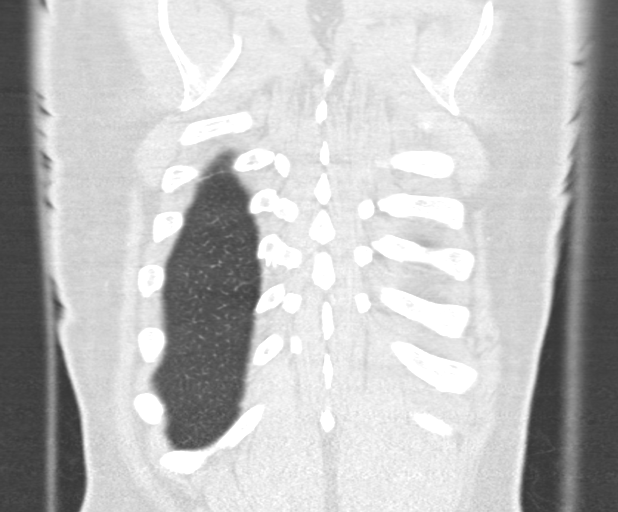
[im 61/151  lung]
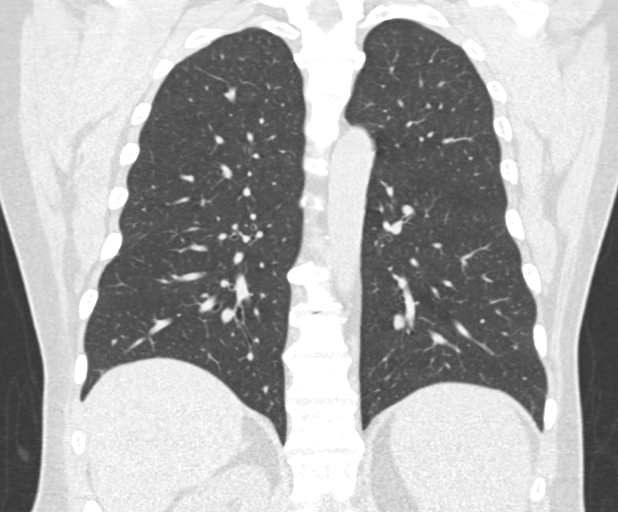
[im 91/151  lung]
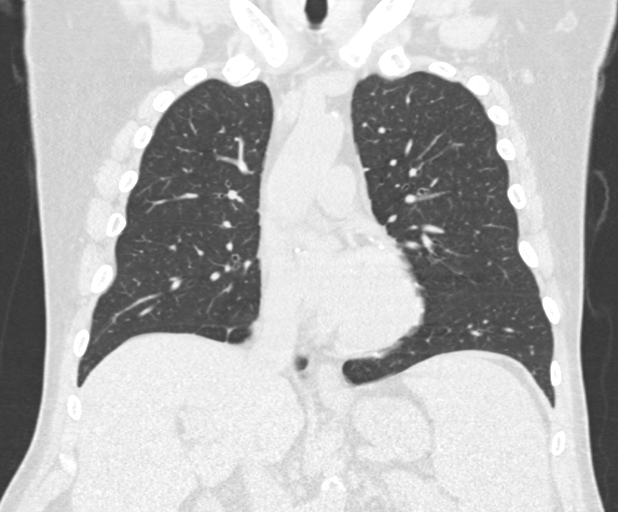

[15 of 36 positions shown; findings below may reference images not displayed]

FINDINGS: Cardiovascular: Atherosclerotic calcifications aorta, coronary
arteries and proximal great vessels. Aorta normal caliber. Heart
normal size. No pericardial effusion.

Mediastinum/Nodes: Esophagus normal appearance. Scattered normal
sized mediastinal and axillary lymph nodes. No thoracic adenopathy.
Base of cervical region normal appearance.

Lungs/Pleura: Calcified granulomata RIGHT middle lobe image 94 and
RIGHT upper lobe image 87. Additional calcified granular remaining
lungs clear. No pulmonary infiltrate, pleural effusion, pneumothorax
or additional mass/nodule.

Upper Abdomen: Cirrhotic liver with splenomegaly and scattered upper
abdominal collaterals, spleen incompletely visualized. Gallbladder
surgically absent.

Musculoskeletal: No osseous abnormalities.
IMPRESSION: Old granulomatous disease RIGHT lung; recommend correlation with any
prior outside imaging patient may have had.

No acute intrathoracic abnormalities.

Cirrhotic liver with splenomegaly and scattered upper abdominal
collaterals.

Aortic Atherosclerosis ([id number]).

## 2021-06-25 ENCOUNTER — Other Ambulatory Visit: Payer: Self-pay | Admitting: Osteopathic Medicine

## 2021-06-25 DIAGNOSIS — E119 Type 2 diabetes mellitus without complications: Secondary | ICD-10-CM

## 2021-06-25 DIAGNOSIS — E1169 Type 2 diabetes mellitus with other specified complication: Secondary | ICD-10-CM

## 2021-06-25 DIAGNOSIS — E785 Hyperlipidemia, unspecified: Secondary | ICD-10-CM

## 2021-07-28 ENCOUNTER — Ambulatory Visit: Payer: PRIVATE HEALTH INSURANCE | Admitting: Osteopathic Medicine

## 2021-08-01 ENCOUNTER — Ambulatory Visit: Payer: PRIVATE HEALTH INSURANCE | Admitting: Osteopathic Medicine

## 2021-08-02 ENCOUNTER — Ambulatory Visit (INDEPENDENT_AMBULATORY_CARE_PROVIDER_SITE_OTHER): Payer: PRIVATE HEALTH INSURANCE | Admitting: Osteopathic Medicine

## 2021-08-02 VITALS — BP 129/76 | HR 73 | Temp 98.0°F | Wt 216.1 lb

## 2021-08-02 DIAGNOSIS — K7460 Unspecified cirrhosis of liver: Secondary | ICD-10-CM

## 2021-08-02 DIAGNOSIS — L989 Disorder of the skin and subcutaneous tissue, unspecified: Secondary | ICD-10-CM | POA: Diagnosis not present

## 2021-08-02 DIAGNOSIS — E119 Type 2 diabetes mellitus without complications: Secondary | ICD-10-CM | POA: Diagnosis not present

## 2021-08-02 DIAGNOSIS — R1030 Lower abdominal pain, unspecified: Secondary | ICD-10-CM

## 2021-08-02 LAB — POCT GLYCOSYLATED HEMOGLOBIN (HGB A1C): Hemoglobin A1C: 6.3 % — AB (ref 4.0–5.6)

## 2021-08-02 NOTE — Patient Instructions (Addendum)
Diabetes A1C looking great at 6.3! Continue on lower carb diet, increase exercise as able No need to adjust medications right now  Vaccines for diabetics Pneumonia vaccines: done! Recommend booster after age 59 COVID vaccine: STRONGLY RECOMMENDED  GI/Liver follow-up Please call Dr Orion Modest office [facility] at [phone] to follow up on liver  Groin/bladder Urology referral in place, someone should call you, let us know if you don't hear anything in 1-2 weeks! Cancer screenings  Colon cancer screening: Colonoscopy due per Dr. Orion Modest instructions in 2018.  Lung cancer screening: CT chest every year for those aged 52 to 80 years who have a 20 pack-year smoking history and currently smoke or have quit within the past 15 years. CT showed granulomatous (scarred tissue) in right upper lung but no evidence of tumor/cancer.

## 2021-08-02 NOTE — Progress Notes (Signed)
Jeffrey Nichols is a 60 y.o. male who presents to  [place] at [facility]  today, 08/03/21, seeking care for the following:   DM2 FOLLOW-UP    Current meds: metformin 1000 mg bid, glipizide 5 mg bid  On ACE, statin LDL at goal 44 as of 10/2920 BP bit above goal    08/02/21 (today): 6.3, yay!  03/23/21 (last visit): 7.8 --> pt reluctant to adjust meds, he reports room for improvement on diet/exercise. Advised if going up, will need to be mre aggressive w/ Rx.  11/23/20: 7.6 --> pt opted to work on lifestyle measures, keep Rx same 05/18/20: 7.5    REQUEST UROLOGY REFERRAL  Concern for lesions on genital skin, GW, would like to discuss treatment/removal. Also concerned about pressure type feeling in groin,no dysuria, no penile pain or discharge    ASSESSMENT & PLAN with other pertinent findings:  The primary encounter diagnosis was Type 2 diabetes mellitus without complication, without long-term current use of insulin ([place]). Diagnoses of Cirrhosis of liver without ascites, unspecified hepatic cirrhosis type ([place]), Inguinal pain, unspecified laterality, and Skin lesion on penis - urology referral requested by patient  were also pertinent to this visit.   Has not yet f/u w/ GI re: cirrhosis A1C/DM2 under good control  See pt instructions as below  Patient Instructions  Diabetes A1C looking great at 6.3! Continue on lower carb diet, increase exercise as able No need to adjust medications right now  Vaccines for diabetics Pneumonia vaccines: done! Recommend booster after age 42 COVID vaccine: STRONGLY RECOMMENDED  GI/Liver follow-up Please call Dr Electa Sniff office [place] at [phone] to follow up on liver  Groin/bladder Urology referral in place, someone should call you, let us know if you don't hear anything in 1-2 weeks! Cancer screenings  Colon cancer screening: Colonoscopy due per Dr. Electa Sniff instructions in 2018.  Lung cancer  screening: CT chest every year for those aged 7 to 33 years who have a 20 pack-year smoking history and currently smoke or have quit within the past 15 years. CT showed granulomatous (scarred tissue) in right upper lung but no evidence of tumor/cancer.   Orders Placed This Encounter  Procedures   CBC   COMPLETE METABOLIC PANEL WITH GFR   Ambulatory referral to Urology   POCT HgB A1C    No orders of the defined types were placed in this encounter.    See below for relevant physical exam findings  See below for recent lab and imaging results reviewed  Medications, allergies, PMH, PSH, SocH, FamH reviewed below    Follow-up instructions: Return in about 6 months (around 01/30/2022) for ESTABLISH CARE W/ DR MATTHEWS.                                        Exam:  BP 129/76 (BP Location: Left Arm, Patient Position: Sitting, Cuff Size: Normal)   Pulse 73   Temp 98 F (36.7 C) (Oral)   Wt 216 lb 1.9 oz (98 kg)   BMI 31.01 kg/m  Constitutional: VS see above. General Appearance: alert, well-developed, well-nourished, NAD Neck: No masses, trachea midline.  Respiratory: Normal respiratory effort. no wheeze, no rhonchi, no rales Cardiovascular: S1/S2 normal, no murmur, no rub/gallop auscultated. RRR.  Musculoskeletal: Gait normal. Symmetric and independent movement of all extremities Abdominal: non-tender, non-distended, no appreciable organomegaly, neg Murphy's, BS WNLx4 Neurological: Normal  balance/coordination. No tremor. Skin: warm, dry, intact.  Psychiatric: Normal judgment/insight. Normal mood and affect. Oriented x3.   Current Meds  Medication Sig   albuterol (VENTOLIN HFA) 108 (90 Base) MCG/ACT inhaler Inhale 1-2 puffs into the lungs every 4 (four) hours as needed for wheezing or shortness of breath.   atorvastatin (LIPITOR) 40 MG tablet Take 1 tablet by mouth once daily   enalapril (VASOTEC) 20 MG tablet Take 2 tablets (40 mg total) by  mouth daily.   fluticasone (FLOVENT HFA) 44 MCG/ACT inhaler Inhale 2 puffs into the lungs in the morning and at bedtime.   gabapentin (NEURONTIN) 300 MG capsule One tablet TID, max of 3,$Remove'600mg'FIRsuRn$ /day   glipiZIDE (GLUCOTROL) 5 MG tablet Take 1 tablet by mouth twice daily   hydrochlorothiazide (HYDRODIURIL) 12.5 MG tablet Take 1 tablet (12.5 mg total) by mouth daily.   metFORMIN (GLUCOPHAGE) 1000 MG tablet Take 1 tablet (1,000 mg total) by mouth 2 (two) times daily with a meal.    Allergies  Allergen Reactions   Trazodone And Nefazodone     As per pt, made him feel "funny".   Meloxicam Other (See Comments)    headache     Patient Active Problem List   Diagnosis Date Noted   Cirrhosis of liver without ascites ([place]) 08/02/2021   Peroneal tendinitis, right 11/16/2019   Lumbar spinal stenosis 10/02/2019   Chronic pain of right ankle 10/02/2019   Abdominal aortic atherosclerosis ([place]) 05/22/2018   Chronic bilateral low back pain without sciatica 10/03/2017   Essential hypertension 10/02/2017   Colon polyps 04/17/2017   Skin lesion of scalp 03/07/2016   Abnormal testicular exam 03/07/2016   Type 2 diabetes mellitus without complication ([place]) 42/39/5320   Thrombocytopenia ([place]) 03/07/2016   Trigger finger, left 10/19/2015   Trigger finger, right 10/19/2015    Family History  Problem Relation Age of Onset   Bladder Cancer Father    Skin cancer Father    Diabetes Father    Diabetes Mother     Social History   Tobacco Use  Smoking Status Every Day   Packs/day: 1.00   Years: 30.00   Pack years: 30.00   Types: Cigarettes  Smokeless Tobacco Never    Past Surgical History:  Procedure Laterality Date   ABCESS DRAINAGE     Nipple   CHOLECYSTECTOMY     ROTATOR CUFF REPAIR Right    TONSILLECTOMY      Immunization History  Administered Date(s) Administered   Influenza-Unspecified 09/04/2018   Pneumococcal Polysaccharide-23 04/30/2005   Tdap 05/22/2018    Recent Results  (from the past 2160 hour(s))  CBC     Status: Abnormal   Collection Time: 08/02/21 12:00 AM  Result Value Ref Range   WBC 3.2 (L) 3.8 - 10.8 Thousand/uL   RBC 4.53 4.20 - 5.80 Million/uL   Hemoglobin 13.8 13.2 - 17.1 g/dL   HCT 41.3 38.5 - 50.0 %   MCV 91.2 80.0 - 100.0 fL   MCH 30.5 27.0 - 33.0 pg   MCHC 33.4 32.0 - 36.0 g/dL   RDW 14.4 11.0 - 15.0 %   Platelets 73 (L) 140 - 400 Thousand/uL   MPV 11.5 7.5 - 12.5 fL  COMPLETE METABOLIC PANEL WITH GFR     Status: Abnormal   Collection Time: 08/02/21 12:00 AM  Result Value Ref Range   Glucose, Bld 98 65 - 99 mg/dL    Comment: .            Fasting reference  interval .    BUN 9 7 - 25 mg/dL   Creat 0.64 (L) 0.70 - 1.30 mg/dL   eGFR 109 > OR = 60 mL/min/1.83m2    Comment: The eGFR is based on the CKD-EPI 2021 equation. To calculate  the new eGFR from a previous Creatinine or Cystatin C result, go to https://www.kidney.org/professionals/ kdoqi/gfr%5Fcalculator    BUN/Creatinine Ratio 14 6 - 22 (calc)   Sodium 140 135 - 146 mmol/L   Potassium 4.0 3.5 - 5.3 mmol/L   Chloride 108 98 - 110 mmol/L   CO2 24 20 - 32 mmol/L   Calcium 8.8 8.6 - 10.3 mg/dL   Total Protein 6.7 6.1 - 8.1 g/dL   Albumin 3.8 3.6 - 5.1 g/dL   Globulin 2.9 1.9 - 3.7 g/dL (calc)   AG Ratio 1.3 1.0 - 2.5 (calc)   Total Bilirubin 1.0 0.2 - 1.2 mg/dL   Alkaline phosphatase (APISO) 75 35 - 144 U/L   AST 43 (H) 10 - 35 U/L   ALT 40 9 - 46 U/L  POCT HgB A1C     Status: Abnormal   Collection Time: 08/02/21  8:52 AM  Result Value Ref Range   Hemoglobin A1C 6.3 (A) 4.0 - 5.6 %   HbA1c POC (<> result, manual entry)     HbA1c, POC (prediabetic range)     HbA1c, POC (controlled diabetic range)      No results found.     All questions at time of visit were answered - patient instructed to contact office with any additional concerns or updates. ER/RTC precautions were reviewed with the patient as applicable.   Please note: manual typing as well as voice  recognition software may have been used to produce this document - typos may escape review. Please contact Dr. Sheppard Coil for any needed clarifications.

## 2021-08-03 LAB — CBC
HCT: 41.3 % (ref 38.5–50.0)
Hemoglobin: 13.8 g/dL (ref 13.2–17.1)
MCH: 30.5 pg (ref 27.0–33.0)
MCHC: 33.4 g/dL (ref 32.0–36.0)
MCV: 91.2 fL (ref 80.0–100.0)
MPV: 11.5 fL (ref 7.5–12.5)
Platelets: 73 10*3/uL — ABNORMAL LOW (ref 140–400)
RBC: 4.53 10*6/uL (ref 4.20–5.80)
RDW: 14.4 % (ref 11.0–15.0)
WBC: 3.2 10*3/uL — ABNORMAL LOW (ref 3.8–10.8)

## 2021-08-03 LAB — COMPLETE METABOLIC PANEL WITH GFR
AG Ratio: 1.3 (calc) (ref 1.0–2.5)
ALT: 40 U/L (ref 9–46)
AST: 43 U/L — ABNORMAL HIGH (ref 10–35)
Albumin: 3.8 g/dL (ref 3.6–5.1)
Alkaline phosphatase (APISO): 75 U/L (ref 35–144)
BUN/Creatinine Ratio: 14 (calc) (ref 6–22)
BUN: 9 mg/dL (ref 7–25)
CO2: 24 mmol/L (ref 20–32)
Calcium: 8.8 mg/dL (ref 8.6–10.3)
Chloride: 108 mmol/L (ref 98–110)
Creat: 0.64 mg/dL — ABNORMAL LOW (ref 0.70–1.30)
Globulin: 2.9 g/dL (calc) (ref 1.9–3.7)
Glucose, Bld: 98 mg/dL (ref 65–99)
Potassium: 4 mmol/L (ref 3.5–5.3)
Sodium: 140 mmol/L (ref 135–146)
Total Bilirubin: 1 mg/dL (ref 0.2–1.2)
Total Protein: 6.7 g/dL (ref 6.1–8.1)
eGFR: 109 mL/min/{1.73_m2} (ref >=60)

## 2021-08-04 ENCOUNTER — Encounter: Payer: Self-pay | Admitting: Osteopathic Medicine

## 2021-08-27 ENCOUNTER — Telehealth: Payer: PRIVATE HEALTH INSURANCE | Admitting: Nurse Practitioner

## 2021-08-27 ENCOUNTER — Encounter: Payer: Self-pay | Admitting: Nurse Practitioner

## 2021-08-27 DIAGNOSIS — U071 COVID-19: Secondary | ICD-10-CM | POA: Diagnosis not present

## 2021-08-27 MED ORDER — NIRMATRELVIR/RITONAVIR (PAXLOVID)TABLET: 3.0000 | ORAL_TABLET | Freq: Two times a day (BID) | ORAL | 0 refills | Status: AC

## 2021-08-27 MED ORDER — ALBUTEROL SULFATE HFA 108 (90 BASE) MCG/ACT IN AERS: 1.0000 | INHALATION_SPRAY | RESPIRATORY_TRACT | 5 refills | Status: AC | PRN

## 2021-08-27 NOTE — Patient Instructions (Addendum)
E-Visit  for Positive Covid Test Result  We are sorry you are not feeling well. We are here to help!  You have tested positive for COVID-19, meaning that you were infected with the novel coronavirus and could give the virus to others.  It is vitally important that you stay home so you do not spread it to others.      Please continue isolation at home, for at least 10 days since the start of your symptoms and until you have had 24 hours with no fever (without taking a fever reducer) and with improving of symptoms.  If you have no symptoms but tested positive (or all symptoms resolve after 5 days and you have no fever) you can leave your house but continue to wear a mask around others for an additional 5 days. If you have a fever,continue to stay home until you have had 24 hours of no fever. Most cases improve 5-10 days from onset but we have seen a small number of patients who have gotten worse after the 10 days.  Please be sure to watch for worsening symptoms and remain taking the proper precautions.   Go to the nearest hospital ED for assessment if fever/cough/breathlessness are severe or illness seems like a threat to life.    The following symptoms may appear 2-14 days after exposure: Fever Cough Shortness of breath or difficulty breathing Chills Repeated shaking with chills Muscle pain Headache Sore throat New loss of taste or smell Fatigue Congestion or runny nose Nausea or vomiting Diarrhea  You have been enrolled in MyChart Home Monitoring for COVID-19. Daily you will receive a questionnaire within the MyChart website. Our COVID-19 response team will be monitoring your responses daily.  I have prescribed the antiviral PAXLOVID for you to begin taking immediately.  INSTRUCTIONS: Continue your flonase for nasal congestion Drink plenty of fluids, rest and wash hands frequently to avoid the spread of infection Alternate tylenol and Motrin for relief of fever    HOME  CARE: Only take medications as instructed by your medical team. Drink plenty of fluids and get plenty of rest. A steam or ultrasonic humidifier can help if you have congestion.   GET HELP RIGHT AWAY IF YOU HAVE EMERGENCY WARNING SIGNS.  Call 911 or proceed to your closest emergency facility if: You develop worsening high fever. Trouble breathing Bluish lips or face Persistent pain or pressure in the chest New confusion Inability to wake or stay awake You cough up blood. Your symptoms become more severe Inability to hold down food or fluids  This list is not all possible symptoms. Contact your medical provider for any symptoms that are severe or concerning to you.    Your e-visit answers were reviewed by a board certified advanced clinical practitioner to complete your personal care plan.  Depending on the condition, your plan could have included both over the counter or prescription medications.  If there is a problem please reply once you have received a response from your provider.  Your safety is important to [place].  If you have drug allergies check your prescription carefully.    You can use MyChart to ask questions about today's visit, request a non-urgent call back, or ask for a work or school excuse for 24 hours related to this e-Visit. If it has been greater than 24 hours you will need to follow up with your provider, or enter a new e-Visit to address those concerns. You will get an e-mail in  the next two days asking about your experience.  I hope that your e-visit has been valuable and will speed your recovery. Thank you for using e-visits.

## 2021-08-27 NOTE — Progress Notes (Signed)
Virtual Visit Consent   Jeffrey Nichols, you are scheduled for a virtual visit with a [facility] provider today.     Just as with appointments in the office, your consent must be obtained to participate.  Your consent will be active for this visit and any virtual visit you may have with one of our providers in the next 365 days.     If you have a MyChart account, a copy of this consent can be sent to you electronically.  All virtual visits are billed to your insurance company just like a traditional visit in the office.    As this is a virtual visit, video technology does not allow for your provider to perform a traditional examination.  This may limit your provider's ability to fully assess your condition.  If your provider identifies any concerns that need to be evaluated in person or the need to arrange testing (such as labs, EKG, etc.), we will make arrangements to do so.     Although advances in technology are sophisticated, we cannot ensure that it will always work on either your end or our end.  If the connection with a video visit is poor, the visit may have to be switched to a telephone visit.  With either a video or telephone visit, we are not always able to ensure that we have a secure connection.     I need to obtain your verbal consent now.   Are you willing to proceed with your visit today?    Jeffrey Nichols has provided verbal consent on 08/27/2021 for a virtual visit (video or telephone).   Jeffrey Rigg, NP   Date: 08/27/2021 4:46 PM   Virtual Visit via Video Note   I, Jeffrey Nichols, connected with  Jeffrey Nichols  ([id number], 1961/06/11) on 08/27/21 at  4:30 PM EDT by a video-enabled telemedicine application and verified that I am speaking with the correct person using two identifiers.  Location: Patient: Virtual Visit Location Patient: Home Provider: Virtual Visit Location Provider: Home Office   I discussed the limitations of evaluation and management by telemedicine and  the availability of in person appointments. The patient expressed understanding and agreed to proceed.    History of Present Illness: Jeffrey Nichols is a 60 y.o. who identifies as a male who was assigned male at birth, and is being seen today for COVID POSITIVE.  HPI:  Patient states he took a Rapid test x2. First one positive yesterday and second one positive today.  Current Symptoms:   Cough Fever 101  Headache Nasal and chest congestion  Taking otc decongestant,  ibuprofen and coricidin, Oscillococcinum: Agreeable to antiviral today.    Problems:  Patient Active Problem List   Diagnosis Date Noted   Cirrhosis of liver without ascites ([facility]) 08/02/2021   Peroneal tendinitis, right 11/16/2019   Lumbar spinal stenosis 10/02/2019   Chronic pain of right ankle 10/02/2019   Abdominal aortic atherosclerosis ([facility]) 05/22/2018   Chronic bilateral low back pain without sciatica 10/03/2017   Essential hypertension 10/02/2017   Colon polyps 04/17/2017   Skin lesion of scalp 03/07/2016   Abnormal testicular exam 03/07/2016   Type 2 diabetes mellitus without complication ([facility]) 03/07/2016   Thrombocytopenia ([facility]) 03/07/2016   Trigger finger, left 10/19/2015   Trigger finger, right 10/19/2015    Allergies:  Allergies  Allergen Reactions   Trazodone And Nefazodone     As per pt, made him feel "funny".   Meloxicam Other (See Comments)  headache    Medications:  Current Outpatient Medications:    nirmatrelvir/ritonavir EUA (PAXLOVID) 20 x 150 MG & 10 x 100MG  TABS, Take 3 tablets by mouth 2 (two) times daily for 5 days. (Take nirmatrelvir 150 mg two tablets twice daily for 5 days and ritonavir 100 mg one tablet twice daily for 5 days) Patient GFR is 109, Disp: 30 tablet, Rfl: 0   albuterol (VENTOLIN HFA) 108 (90 Base) MCG/ACT inhaler, Inhale 1-2 puffs into the lungs every 4 (four) hours as needed for wheezing or shortness of breath., Disp: 2 each, Rfl: 5   atorvastatin (LIPITOR) 40 MG  tablet, Take 1 tablet by mouth once daily, Disp: 90 tablet, Rfl: 0   enalapril (VASOTEC) 20 MG tablet, Take 2 tablets (40 mg total) by mouth daily., Disp: 180 tablet, Rfl: 3   fluticasone (FLOVENT HFA) 44 MCG/ACT inhaler, Inhale 2 puffs into the lungs in the morning and at bedtime., Disp: 1 each, Rfl: 12   gabapentin (NEURONTIN) 300 MG capsule, One tablet TID, max of 3,600mg /day, Disp: 90 capsule, Rfl: 3   glipiZIDE (GLUCOTROL) 5 MG tablet, Take 1 tablet by mouth twice daily, Disp: 180 tablet, Rfl: 0   hydrochlorothiazide (HYDRODIURIL) 12.5 MG tablet, Take 1 tablet (12.5 mg total) by mouth daily., Disp: 90 tablet, Rfl: 3   metFORMIN (GLUCOPHAGE) 1000 MG tablet, Take 1 tablet (1,000 mg total) by mouth 2 (two) times daily with a meal., Disp: 180 tablet, Rfl: 3  Observations/Objective: Patient is well-developed, well-nourished in no acute distress.  Resting comfortably in a chair at home.  Head is normocephalic, atraumatic.  No labored breathing.  Speech is clear and coherent with logical content.  Patient is alert and oriented at baseline.    Assessment and Plan: 1. COVID-19 - nirmatrelvir/ritonavir EUA (PAXLOVID) 20 x 150 MG & 10 x 100MG  TABS; Take 3 tablets by mouth 2 (two) times daily for 5 days. (Take nirmatrelvir 150 mg two tablets twice daily for 5 days and ritonavir 100 mg one tablet twice daily for 5 days) Patient GFR is 109  Dispense: 30 tablet; Refill: 0 - albuterol (VENTOLIN HFA) 108 (90 Base) MCG/ACT inhaler; Inhale 1-2 puffs into the lungs every 4 (four) hours as needed for wheezing or shortness of breath.  Dispense: 2 each; Refill: 5 INSTRUCTIONS: Continue your flonase for nasal congestion Drink plenty of fluids, rest and wash hands frequently to avoid the spread of infection Alternate tylenol and Motrin for relief of fever   Follow Up Instructions: I discussed the assessment and treatment plan with the patient. The patient was provided an opportunity to ask questions and all  were answered. The patient agreed with the plan and demonstrated an understanding of the instructions.  A copy of instructions were sent to the patient via MyChart unless otherwise noted below.    The patient was advised to call back or seek an in-person evaluation if the symptoms worsen or if the condition fails to improve as anticipated.  Time:  I spent 12 minutes with the patient via telehealth technology discussing the above problems/concerns.    , NP

## 2021-08-30 ENCOUNTER — Telehealth: Payer: Self-pay

## 2021-08-30 NOTE — Telephone Encounter (Signed)
Patient called and left a voicemail on the nurse line yesterday. Returned his call today and he had some questions about the Paxlovid side affects and was concerned he was having jet black stools. He has a history of an enlarged spleen and cirrhosis. He was concerned of the possible liver damage the medication could cause. After speaking with Tandy Gaw, PA-C, she checked his liver and kidney function and concluded the medication should not cause any liver damage. The patient also has thrombocytopenia which Lesly Rubenstein said could be the cause of the black stools. She recommend the patient go to Urgent Care so a hemoccult could be completed to rule out blood being in his stool. Patient agreed with the recommendation and advice from the provider.

## 2021-09-30 ENCOUNTER — Other Ambulatory Visit: Payer: Self-pay | Admitting: Osteopathic Medicine

## 2021-09-30 DIAGNOSIS — E119 Type 2 diabetes mellitus without complications: Secondary | ICD-10-CM

## 2022-01-12 ENCOUNTER — Other Ambulatory Visit: Payer: Self-pay | Admitting: Osteopathic Medicine

## 2022-01-12 ENCOUNTER — Other Ambulatory Visit: Payer: Self-pay | Admitting: Sports Medicine

## 2022-01-12 DIAGNOSIS — I10 Essential (primary) hypertension: Secondary | ICD-10-CM

## 2022-01-12 DIAGNOSIS — E119 Type 2 diabetes mellitus without complications: Secondary | ICD-10-CM

## 2022-10-21 ENCOUNTER — Ambulatory Visit
Admission: EM | Admit: 2022-10-21 | Discharge: 2022-10-21 | Disposition: A | Discharge status: Home / Self Care | Payer: BC Managed Care – PPO | Attending: Family Medicine | Admitting: Family Medicine

## 2022-10-21 ENCOUNTER — Encounter: Payer: Self-pay | Admitting: Emergency Medicine

## 2022-10-21 DIAGNOSIS — J209 Acute bronchitis, unspecified: Secondary | ICD-10-CM | POA: Diagnosis not present

## 2022-10-21 DIAGNOSIS — F17200 Nicotine dependence, unspecified, uncomplicated: Secondary | ICD-10-CM

## 2022-10-21 MED ORDER — AMOXICILLIN-POT CLAVULANATE 875-125 MG PO TABS: 1.0000 | ORAL_TABLET | Freq: Two times a day (BID) | ORAL | 0 refills | Status: AC

## 2022-10-21 MED ORDER — PREDNISONE 20 MG PO TABS: 20.0000 mg | ORAL_TABLET | Freq: Two times a day (BID) | ORAL | 0 refills | Status: AC

## 2022-10-21 NOTE — ED Provider Notes (Signed)
[facility]    CSN: [id number] Arrival date & time: 10/21/22  1241      History   Chief Complaint Chief Complaint  Patient presents with   Cough    HPI Jeffrey Nichols is a 61 y.o. male.   HPI  Patient is a smoker.  Denies underlying lung disease.  Has had cough and cold for a week.  Since yesterday he has developed a rattle in his chest, a deep cough, and a little more difficulty breathing.  His wife is encouraging him to come in for evaluation.  COVID test at home is negative. Patient has chronic atherosclerosis, type 2 diabetes, cirrhosis.  Past Medical History:  Diagnosis Date   Diabetes mellitus ([place])    Diabetes type 2, controlled ([place]) 10/19/2015    Patient Active Problem List   Diagnosis Date Noted   Cirrhosis of liver without ascites ([place]) 08/02/2021   Peroneal tendinitis, right 11/16/2019   Lumbar spinal stenosis 10/02/2019   Chronic pain of right ankle 10/02/2019   Abdominal aortic atherosclerosis ([place]) 05/22/2018   Chronic bilateral low back pain without sciatica 10/03/2017   Essential hypertension 10/02/2017   Colon polyps 04/17/2017   Skin lesion of scalp 03/07/2016   Abnormal testicular exam 03/07/2016   Type 2 diabetes mellitus without complication ([place]) [id number]   Thrombocytopenia ([place]) 03/07/2016   Trigger finger, left 10/19/2015   Trigger finger, right 10/19/2015    Past Surgical History:  Procedure Laterality Date   ABCESS DRAINAGE     Nipple   CHOLECYSTECTOMY     ROTATOR CUFF REPAIR Right    TONSILLECTOMY         Home Medications    Prior to Admission medications   Medication Sig Start Date End Date Taking? Authorizing Provider  amoxicillin-clavulanate (AUGMENTIN) 875-125 MG tablet Take 1 tablet by mouth every 12 (twelve) hours. 10/21/22  Yes Raylene Everts, MD  predniSONE (DELTASONE) 20 MG tablet Take 1 tablet (20 mg total) by mouth 2 (two) times daily with a meal. 10/21/22  Yes Raylene Everts, MD  albuterol  (VENTOLIN HFA) 108 (90 Base) MCG/ACT inhaler Inhale 1-2 puffs into the lungs every 4 (four) hours as needed for wheezing or shortness of breath. 08/27/21   Gildardo Pounds, NP  atorvastatin (LIPITOR) 40 MG tablet Take 1 tablet by mouth once daily Patient not taking: Reported on 10/21/2022 06/26/21   Emeterio Reeve, DO  enalapril (VASOTEC) 20 MG tablet Take 2 tablets (40 mg total) by mouth daily. NO REFILLS. NEEDS TO TRANSFER CARE TO NEW PCP. 01/12/22   Hali Marry, MD  fluticasone (FLOVENT HFA) 44 MCG/ACT inhaler Inhale 2 puffs into the lungs in the morning and at bedtime. Patient not taking: Reported on 10/21/2022 02/06/21   Caleen Jobs B, NP  gabapentin (NEURONTIN) 300 MG capsule One tablet TID, max of 3,600mg /day 06/01/21   Emeterio Reeve, DO  glipiZIDE (GLUCOTROL) 5 MG tablet Take 1 tablet by mouth twice daily 01/15/22   Hali Marry, MD  hydrochlorothiazide (HYDRODIURIL) 12.5 MG tablet Take 1 tablet (12.5 mg total) by mouth daily. NO REFILLS. NEEDS TO TRANSFER CARE TO NEW PCP. 01/12/22   Hali Marry, MD  metFORMIN (GLUCOPHAGE) 1000 MG tablet Take 1 tablet (1,000 mg total) by mouth 2 (two) times daily with a meal. NO REFILLS. NEEDS TO TRANSFER CARE TO NEW PCP. 01/12/22   Hali Marry, MD    Family History Family History  Problem Relation Age of Onset   Diabetes Mother  Bladder Cancer Father    Skin cancer Father    Diabetes Father     Social History Social History   Tobacco Use   Smoking status: Every Day    Packs/day: 0.75    Years: 30.00    Total pack years: 22.50    Types: Cigarettes   Smokeless tobacco: Never  Vaping Use   Vaping Use: Never used  Substance Use Topics   Alcohol use: Yes    Alcohol/week: 0.0 standard drinks of alcohol   Drug use: No     Allergies   Trazodone and nefazodone and Meloxicam   Review of Systems Review of Systems See HPI  Physical Exam Triage Vital Signs ED Triage Vitals  Enc Vitals Group      BP 10/21/22 1331 131/71     Pulse Rate 10/21/22 1331 77     Resp 10/21/22 1331 18     Temp 10/21/22 1331 99.2 F (37.3 C)     Temp Source 10/21/22 1331 Oral     SpO2 10/21/22 1331 96 %     Weight 10/21/22 1334 222 lb (100.7 kg)     Height 10/21/22 1334 5\' 10"  (1.778 m)     Head Circumference --      Peak Flow --      Pain Score 10/21/22 1333 4     Pain Loc --      Pain Edu? --      Excl. in [place]? --    No data found.  Updated Vital Signs BP 131/71 (BP Location: Left Arm)   Pulse 77   Temp 99.2 F (37.3 C) (Oral)   Resp 18   Ht 5\' 10"  (1.778 m)   Wt 100.7 kg   SpO2 96%   BMI 31.85 kg/m      Physical Exam Constitutional:      General: He is not in acute distress.    Appearance: He is well-developed. He is ill-appearing.  HENT:     Head: Normocephalic and atraumatic.     Right Ear: Tympanic membrane and ear canal normal.     Left Ear: Tympanic membrane and ear canal normal.     Nose: Nose normal. No congestion.     Mouth/Throat:     Pharynx: No posterior oropharyngeal erythema.  Eyes:     Conjunctiva/sclera: Conjunctivae normal.     Pupils: Pupils are equal, round, and reactive to light.  Cardiovascular:     Rate and Rhythm: Normal rate.  Pulmonary:     Effort: Pulmonary effort is normal. No respiratory distress.     Breath sounds: Rhonchi present.     Comments: Rhonchi throughout, scattered wheeze Abdominal:     General: There is no distension.     Palpations: Abdomen is soft.  Musculoskeletal:        General: Normal range of motion.     Cervical back: Normal range of motion.  Skin:    General: Skin is warm and dry.  Neurological:     Mental Status: He is alert.  Psychiatric:        Mood and Affect: Mood normal.        Behavior: Behavior normal.      UC Treatments / Results  Labs (all labs ordered are listed, but only abnormal results are displayed) Labs Reviewed - No data to display  EKG   Radiology No results found.  Procedures Procedures  (including critical care time)  Medications Ordered in UC Medications - No data to display  Initial Impression / Assessment and Plan / UC Course  I have reviewed the triage vital signs and the nursing notes.  Pertinent labs & imaging results that were available during my care of the patient were reviewed by me and considered in my medical decision making (see chart for details).     Final Clinical Impressions(s) / UC Diagnoses   Final diagnoses:  Acute bronchitis, unspecified organism     Discharge Instructions      Continue Mucinex DM Drink lots of water Take antibiotic 2 times a day Take prednisone 2 times a day.  If it makes you irritable may reduce to once a day Use albuterol inhaler as needed See your doctor if not improving by next week   ED Prescriptions     Medication Sig Dispense Auth. Provider   predniSONE (DELTASONE) 20 MG tablet Take 1 tablet (20 mg total) by mouth 2 (two) times daily with a meal. 10 tablet Eustace Moore, MD   amoxicillin-clavulanate (AUGMENTIN) 875-125 MG tablet Take 1 tablet by mouth every 12 (twelve) hours. 14 tablet Eustace Moore, MD      PDMP not reviewed this encounter.   Eustace Moore, MD 10/21/22 [phone]

## 2022-10-21 NOTE — ED Triage Notes (Signed)
Sinus congestion w/ cough x 1 week  OTC  mucinex, ibuprofen  & cold meds - min relief Negative home COVID tests x 2  Parents had COVID 2 weeks ago

## 2022-10-21 NOTE — Discharge Instructions (Signed)
Continue Mucinex DM Drink lots of water Take antibiotic 2 times a day Take prednisone 2 times a day.  If it makes you irritable may reduce to once a day Use albuterol inhaler as needed See your doctor if not improving by next week

## 2022-10-22 ENCOUNTER — Telehealth: Payer: Self-pay | Admitting: Emergency Medicine

## 2022-10-22 NOTE — Telephone Encounter (Signed)
LMTRC.  Advised if doing well to disregard the call.  Any questions or concerns feel free to give the office a call back. 

## 2024-07-28 ENCOUNTER — Encounter: Payer: Self-pay | Admitting: Sports Medicine
